# Patient Record
Sex: Female | Born: 1975 | Race: Black or African American | Hispanic: No | Marital: Married | Smoking: Never smoker
Health system: Southern US, Community
[De-identification: ages and names within clinical notes are randomized; demographics above are authoritative.]

## PROBLEM LIST (undated history)

## (undated) DIAGNOSIS — N9489 Other specified conditions associated with female genital organs and menstrual cycle: Secondary | ICD-10-CM

## (undated) DIAGNOSIS — N939 Abnormal uterine and vaginal bleeding, unspecified: Secondary | ICD-10-CM

## (undated) HISTORY — PX: NO PAST SURGERIES: SHX2092

---

## 2017-11-15 ENCOUNTER — Other Ambulatory Visit: Payer: Self-pay

## 2017-11-15 ENCOUNTER — Emergency Department (HOSPITAL_COMMUNITY)
Admission: EM | Admit: 2017-11-15 | Discharge: 2017-11-15 | Disposition: A | Discharge status: Home / Self Care | Payer: Self-pay | Attending: Emergency Medicine | Admitting: Emergency Medicine

## 2017-11-15 ENCOUNTER — Encounter (HOSPITAL_COMMUNITY): Payer: Self-pay

## 2017-11-15 DIAGNOSIS — R1030 Lower abdominal pain, unspecified: Secondary | ICD-10-CM | POA: Insufficient documentation

## 2017-11-15 DIAGNOSIS — N939 Abnormal uterine and vaginal bleeding, unspecified: Secondary | ICD-10-CM | POA: Insufficient documentation

## 2017-11-15 LAB — COMPREHENSIVE METABOLIC PANEL
ALBUMIN: 4.2 g/dL (ref 3.5–5.0)
ALK PHOS: 77 U/L (ref 38–126)
ALT: 18 U/L (ref 14–54)
AST: 24 U/L (ref 15–41)
Anion gap: 10 (ref 5–15)
BILIRUBIN TOTAL: 0.5 mg/dL (ref 0.3–1.2)
BUN: 6 mg/dL (ref 6–20)
CALCIUM: 9.3 mg/dL (ref 8.9–10.3)
CO2: 21 mmol/L — AB (ref 22–32)
CREATININE: 0.89 mg/dL (ref 0.44–1.00)
Chloride: 108 mmol/L (ref 101–111)
GFR calc non Af Amer: >60 mL/min (ref >60)
GLUCOSE: 87 mg/dL (ref 65–99)
Potassium: 3.9 mmol/L (ref 3.5–5.1)
Sodium: 139 mmol/L (ref 135–145)
TOTAL PROTEIN: 7.4 g/dL (ref 6.5–8.1)

## 2017-11-15 LAB — URINALYSIS, ROUTINE W REFLEX MICROSCOPIC
Bacteria, UA: NONE SEEN
Bilirubin Urine: NEGATIVE
Glucose, UA: NEGATIVE mg/dL
Ketones, ur: NEGATIVE mg/dL
Leukocytes, UA: NEGATIVE
NITRITE: NEGATIVE
PH: 5 (ref 5.0–8.0)
PROTEIN: NEGATIVE mg/dL
SPECIFIC GRAVITY, URINE: 1.014 (ref 1.005–1.030)

## 2017-11-15 LAB — WET PREP, GENITAL
CLUE CELLS WET PREP: NONE SEEN
SPERM: NONE SEEN
Trich, Wet Prep: NONE SEEN
YEAST WET PREP: NONE SEEN

## 2017-11-15 LAB — CBC
HCT: 41.6 % (ref 36.0–46.0)
HEMOGLOBIN: 13.5 g/dL (ref 12.0–15.0)
MCH: 26.3 pg (ref 26.0–34.0)
MCHC: 32.5 g/dL (ref 30.0–36.0)
MCV: 80.9 fL (ref 78.0–100.0)
PLATELETS: 314 10*3/uL (ref 150–400)
RBC: 5.14 MIL/uL — ABNORMAL HIGH (ref 3.87–5.11)
RDW: 13.2 % (ref 11.5–15.5)
WBC: 3.7 10*3/uL — ABNORMAL LOW (ref 4.0–10.5)

## 2017-11-15 LAB — I-STAT BETA HCG BLOOD, ED (MC, WL, AP ONLY): I-stat hCG, quantitative: <5 m[IU]/mL (ref <5)

## 2017-11-15 LAB — LIPASE, BLOOD: Lipase: 36 U/L (ref 11–51)

## 2017-11-15 MED ORDER — NAPROXEN 500 MG PO TABS: 500.0000 mg | ORAL_TABLET | Freq: Two times a day (BID) | ORAL | 0 refills | Status: DC

## 2017-11-15 NOTE — ED Provider Notes (Signed)
[facility] EMERGENCY DEPARTMENT Provider Note   CSN: [id number] Arrival date & time: 11/15/17  1516     History   Chief Complaint Chief Complaint  Patient presents with  . Vaginal Bleeding   History obtained with assistance of patient's husband who helps translate.  [employer] interpreter offered to patient but she denied at this time. HPI Othell Gajda is a 42 y.o. female with no significant past medical history presents emergency department today for vaginal bleeding.  Patient states that she recently did her menstrual cycle on 11/09/2017 after starting on 10/26/2017.  She reports that over the last 3 days she has had clear/bloody vaginal discharge that she states is similar to a normal period for her.  She states that over the last 3 days the bleeding has gotten heavier and now has used 2 pads per day.  She also notes that there is a foul odor and suprapubic cramping involved.  The patient reports that she has had to have intermittent and abnormal menstrual cycles over the last several months.  He is currently sexually active with her husband in a monogamous relationship.  They do not always use protection.  She does not have concerns for STDs at this time.  No prior history of STDs.  She denies any associated fever, chills, nausea/vomiting/diarrhea, vaginal ulcers/lesions, vaginal itching, vaginal pain, urinary frequency, urinary urgency, hematuria, dysuria or flank pain.  HPI  No past medical history on file.  There are no active problems to display for this patient.     OB History    No data available       Home Medications    Prior to Admission medications   Not on File    Family History No family history on file.  Social History Social History   Tobacco Use  . Smoking status: Never Smoker  . Smokeless tobacco: Never Used  Substance Use Topics  . Alcohol use: No    Frequency: Never  . Drug use: Not on file     Allergies   Patient has no  known allergies.   Review of Systems Review of Systems  All other systems reviewed and are negative.    Physical Exam Updated Vital Signs BP 119/82 (BP Location: Right Arm)   Pulse 86   Temp 98.8 F (37.1 C) (Oral)   Resp 14   LMP 11/09/2017 (Within Days)   SpO2 100%   Physical Exam  Constitutional: She appears well-developed and well-nourished.  HENT:  Head: Normocephalic and atraumatic.  Right Ear: External ear normal.  Left Ear: External ear normal.  Nose: Nose normal.  Mouth/Throat: Uvula is midline, oropharynx is clear and moist and mucous membranes are normal. No tonsillar exudate.  Eyes: Pupils are equal, round, and reactive to light. Right eye exhibits no discharge. Left eye exhibits no discharge. No scleral icterus.  Neck: Trachea normal. Neck supple. No spinous process tenderness present. No neck rigidity. Normal range of motion present.  Cardiovascular: Normal rate, regular rhythm and intact distal pulses.  No murmur heard. Pulses:      Radial pulses are 2+ on the right side, and 2+ on the left side.       Dorsalis pedis pulses are 2+ on the right side, and 2+ on the left side.       Posterior tibial pulses are 2+ on the right side, and 2+ on the left side.  No lower extremity swelling or edema. Calves symmetric in size bilaterally.  Pulmonary/Chest: Effort normal  and breath sounds normal. She exhibits no tenderness.  Abdominal: Soft. Bowel sounds are normal. There is no tenderness. There is no rigidity, no rebound, no guarding and no CVA tenderness.  Genitourinary:  Genitourinary Comments: Exam performed by Jacinto HalimMichael M Braidan Ricciardi, exam chaperoned Pelvic exam: normal external genitalia without evidence of trauma. VULVA: normal appearing vulva with no masses, tenderness or lesion. VAGINA: normal appearing vagina with normal color and discharge, no lesions. CERVIX: normal appearing cervix without lesions, cervical motion tenderness absent, cervical os closed with scant  blood in the cervical vault. Wet prep and DNA probe for chlamydia and GC obtained.   ADNEXA: normal adnexa in size, nontender and no masses UTERUS: uterus is normal size, shape, consistency and nontender.   Musculoskeletal: She exhibits no edema.  Lymphadenopathy:    She has no cervical adenopathy.  Neurological: She is alert.  Skin: Skin is warm and dry. No rash noted. She is not diaphoretic.  Psychiatric: She has a normal mood and affect.  Nursing note and vitals reviewed.    ED Treatments / Results  Labs (all labs ordered are listed, but only abnormal results are displayed) Labs Reviewed  COMPREHENSIVE METABOLIC PANEL - Abnormal; Notable for the following components:      Result Value   CO2 21 (*)    All other components within normal limits  CBC - Abnormal; Notable for the following components:   WBC 3.7 (*)    RBC 5.14 (*)    All other components within normal limits  URINALYSIS, ROUTINE W REFLEX MICROSCOPIC - Abnormal; Notable for the following components:   Hgb urine dipstick MODERATE (*)    Squamous Epithelial / LPF 0-5 (*)    All other components within normal limits  LIPASE, BLOOD  I-STAT BETA HCG BLOOD, ED ([facility], [facility], [facility] ONLY)    EKG  EKG Interpretation None       Radiology No results found.  Procedures Procedures (including critical care time)  Medications Ordered in ED Medications - No data to display   Initial Impression / Assessment and Plan / ED Course  I have reviewed the triage vital signs and the nursing notes.  Pertinent labs & imaging results that were available during my care of the patient were reviewed by me and considered in my medical decision making (see chart for details).     42 y.o. female presenting with 3 day history of vaginal bleeding after finishing her menstrual cycle on 11/09/2017.  She notes that she has had intermittent this regularity is with her menstrual cycle for the last several months.  Patient reports that her bleeding  has gotten heavier now and she has used 2 pads today.  There is also a foul odor associated with this suprapubic cramping.  No passing of clots.  She is currently sexually active in a monogamous relationship.  She does not have concerned for STDs at this time.  Patient's vital signs are reassuring.  No hypotension on presentation.  No abdominal tenderness to palpation.  Cervical os is closed and there is scant blood in the cervical vault.  No concern for PID.  Pregnancy test negative.  No concern for ectopic pregnancy or abortion at this time. Patient states she is currently trying to get pregnant and would not like any interventions. She does not have an OBGYN and I will refer her to women's outpatient.   Patient's lab works and UA otherwise reassuring.  Wet prep reassuring.  G/C pending.  No concern for STDs at this  time to treat prophylactically. Specific return precautions discussed. Time was given for all questions to be answered. The patient verbalized understanding and agreement with plan. The patient appears safe for discharge home.  Final Clinical Impressions(s) / ED Diagnoses   Final diagnoses:  Vaginal bleeding    ED Discharge Orders        Ordered    naproxen (NAPROSYN) 500 MG tablet  2 times daily     11/15/17 2234       Princella Pellegrini 11/15/17 2235    Mancel Bale, MD 11/16/17 [phone]

## 2017-11-15 NOTE — ED Triage Notes (Signed)
Pt had her period at the end of last month and is now having vaginal bleeding. Pt states the bleeding has gotten heavier and darker and has a foul odor.

## 2017-11-15 NOTE — Discharge Instructions (Signed)
Your workup today was reassuring.  Your pregnancy test was negative Your lab work did not show anemia.  You were offered OCP for your symptoms but declined at this time.  Please follow up with your OBGYN or use the referral provided.  If you develop worsening or new concerning symptoms you can return to the emergency department for re-evaluation.

## 2017-11-16 LAB — GC/CHLAMYDIA PROBE AMP (~~LOC~~) NOT AT ARMC
Chlamydia: NEGATIVE
Neisseria Gonorrhea: NEGATIVE

## 2017-11-28 ENCOUNTER — Other Ambulatory Visit: Payer: Self-pay | Admitting: Obstetrics and Gynecology

## 2017-11-28 ENCOUNTER — Other Ambulatory Visit (HOSPITAL_COMMUNITY)
Admission: RE | Admit: 2017-11-28 | Discharge: 2017-11-28 | Disposition: A | Discharge status: Home / Self Care | Payer: Self-pay | Source: Ambulatory Visit | Attending: Obstetrics and Gynecology | Admitting: Obstetrics and Gynecology

## 2017-11-28 DIAGNOSIS — Z124 Encounter for screening for malignant neoplasm of cervix: Secondary | ICD-10-CM | POA: Insufficient documentation

## 2017-11-29 LAB — CYTOLOGY - PAP
Diagnosis: NEGATIVE
HPV: NOT DETECTED

## 2017-12-04 ENCOUNTER — Other Ambulatory Visit: Payer: Self-pay | Admitting: Obstetrics and Gynecology

## 2017-12-06 ENCOUNTER — Other Ambulatory Visit: Payer: Self-pay | Admitting: Obstetrics and Gynecology

## 2017-12-06 DIAGNOSIS — Z1231 Encounter for screening mammogram for malignant neoplasm of breast: Secondary | ICD-10-CM

## 2018-02-07 ENCOUNTER — Ambulatory Visit
Admission: RE | Admit: 2018-02-07 | Discharge: 2018-02-07 | Disposition: A | Discharge status: Home / Self Care | Payer: No Typology Code available for payment source | Source: Ambulatory Visit | Attending: Obstetrics and Gynecology | Admitting: Obstetrics and Gynecology

## 2018-02-07 DIAGNOSIS — Z1231 Encounter for screening mammogram for malignant neoplasm of breast: Secondary | ICD-10-CM

## 2018-03-29 ENCOUNTER — Other Ambulatory Visit (HOSPITAL_COMMUNITY): Payer: Self-pay | Admitting: *Deleted

## 2018-03-29 DIAGNOSIS — R928 Other abnormal and inconclusive findings on diagnostic imaging of breast: Secondary | ICD-10-CM

## 2018-04-27 ENCOUNTER — Telehealth (HOSPITAL_COMMUNITY): Payer: Self-pay | Admitting: *Deleted

## 2018-04-27 NOTE — Telephone Encounter (Signed)
Telephoned patient at home number and left message regarding B[facility]CP appointment.

## 2018-05-10 ENCOUNTER — Ambulatory Visit
Admission: RE | Admit: 2018-05-10 | Discharge: 2018-05-10 | Disposition: A | Discharge status: Home / Self Care | Payer: No Typology Code available for payment source | Source: Ambulatory Visit | Attending: Obstetrics and Gynecology | Admitting: Obstetrics and Gynecology

## 2018-05-10 ENCOUNTER — Ambulatory Visit (HOSPITAL_COMMUNITY)
Admission: RE | Admit: 2018-05-10 | Discharge: 2018-05-10 | Disposition: A | Discharge status: Home / Self Care | Payer: Self-pay | Source: Ambulatory Visit | Attending: Obstetrics and Gynecology | Admitting: Obstetrics and Gynecology

## 2018-05-10 ENCOUNTER — Encounter (HOSPITAL_COMMUNITY): Payer: Self-pay

## 2018-05-10 ENCOUNTER — Other Ambulatory Visit: Payer: No Typology Code available for payment source

## 2018-05-10 ENCOUNTER — Other Ambulatory Visit (HOSPITAL_COMMUNITY): Payer: Self-pay | Admitting: Obstetrics and Gynecology

## 2018-05-10 ENCOUNTER — Ambulatory Visit (HOSPITAL_COMMUNITY): Payer: No Typology Code available for payment source

## 2018-05-10 VITALS — BP 120/82

## 2018-05-10 DIAGNOSIS — N6310 Unspecified lump in the right breast, unspecified quadrant: Secondary | ICD-10-CM

## 2018-05-10 DIAGNOSIS — R928 Other abnormal and inconclusive findings on diagnostic imaging of breast: Secondary | ICD-10-CM

## 2018-05-10 DIAGNOSIS — Z1239 Encounter for other screening for malignant neoplasm of breast: Secondary | ICD-10-CM

## 2018-05-10 NOTE — Patient Instructions (Signed)
Explained breast self awareness with Rachel Hodge. Patient did not need a Pap smear today due to last Pap smear and HPV typing was 11/28/2017. Let her know [facility] will cover Pap smears and HPV typing every 5 years unless has a history of abnormal Pap smears. [facility] mamm[facility] and ultrasound per recommendation. Appointment scheduled for Thursday, May 10, 2018 at 1010. Rachel Hodge verbalized understanding.  [place], Kathaleen Maserhristine Poll, RN 10:18 AM

## 2018-05-10 NOTE — Progress Notes (Signed)
[facility] due to recommending additional imaging of the right breast. Screening mammogram completed 02/07/2018.  Pap Smear: Pap smear not completed today. [facility] and normal with negative HPV. Per patient has no history of an abnormal Pap smear. Last Pap smear result is in Epic.  Physical exam: Breasts Breasts symmetrical. No skin abnormalities bilateral breasts. No nipple retraction bilateral breasts. No nipple discharge bilateral breasts. No lymphadenopathy. No lumps palpated bilateral breasts. No complaints of pain or tenderness on exam. [facility] for a right breast diagnostic mammogram and ultrasound per recommendation. Appointment scheduled for Thursday, May 10, 2018 at 1010.        Pelvic/Bimanual No Pap smear completed today since last Pap smear and HPV typing was 11/28/2017. Pap smear not indicated per BCCCP guidelines.   Smoking History: Patient has never smoked.  Patient Navigation: Patient education provided. Access to services provided for [facility]. [place] interpreter provided.   Breast and Cervical Cancer Risk Assessment: Patient has no family history of breast cancer, known genetic mutations, or radiation treatment to the chest before age 42. Patient has no history of cervical dysplasia, immunocompromised, or DES exposure in-utero.  Risk Assessment    Risk Scores      05/10/2018   Last edited by: Lynnell DikeHolland, Sabrina H, LPN   5-year risk: 0.6 %   Lifetime risk: 9.6 %         Used [place] interpreter [employer]adi Sofo from [employer].

## 2018-05-11 ENCOUNTER — Encounter (HOSPITAL_COMMUNITY): Payer: Self-pay | Admitting: *Deleted

## 2018-05-14 ENCOUNTER — Other Ambulatory Visit (HOSPITAL_COMMUNITY): Payer: Self-pay | Admitting: Obstetrics and Gynecology

## 2018-05-14 DIAGNOSIS — N6310 Unspecified lump in the right breast, unspecified quadrant: Secondary | ICD-10-CM

## 2018-05-17 ENCOUNTER — Other Ambulatory Visit (HOSPITAL_COMMUNITY): Payer: Self-pay | Admitting: Obstetrics and Gynecology

## 2018-05-17 ENCOUNTER — Ambulatory Visit
Admission: RE | Admit: 2018-05-17 | Discharge: 2018-05-17 | Disposition: A | Discharge status: Home / Self Care | Payer: No Typology Code available for payment source | Source: Ambulatory Visit | Attending: Obstetrics and Gynecology | Admitting: Obstetrics and Gynecology

## 2018-05-17 DIAGNOSIS — N6310 Unspecified lump in the right breast, unspecified quadrant: Secondary | ICD-10-CM

## 2018-06-26 ENCOUNTER — Ambulatory Visit: Payer: Self-pay | Admitting: Internal Medicine

## 2018-07-06 ENCOUNTER — Telehealth: Payer: Self-pay | Admitting: Internal Medicine

## 2018-10-15 ENCOUNTER — Other Ambulatory Visit: Payer: Self-pay | Admitting: Obstetrics and Gynecology

## 2018-11-16 ENCOUNTER — Other Ambulatory Visit: Payer: Self-pay

## 2018-11-16 ENCOUNTER — Ambulatory Visit
Admission: RE | Admit: 2018-11-16 | Discharge: 2018-11-16 | Disposition: A | Discharge status: Home / Self Care | Payer: Self-pay | Source: Ambulatory Visit | Attending: Obstetrics and Gynecology | Admitting: Obstetrics and Gynecology

## 2018-11-16 ENCOUNTER — Ambulatory Visit: Payer: No Typology Code available for payment source

## 2018-11-16 DIAGNOSIS — N6310 Unspecified lump in the right breast, unspecified quadrant: Secondary | ICD-10-CM

## 2019-01-11 ENCOUNTER — Ambulatory Visit (HOSPITAL_BASED_OUTPATIENT_CLINIC_OR_DEPARTMENT_OTHER)
Admission: RE | Admit: 2019-01-11 | Payer: No Typology Code available for payment source | Source: Home / Self Care | Admitting: Obstetrics and Gynecology

## 2019-01-11 ENCOUNTER — Encounter (HOSPITAL_BASED_OUTPATIENT_CLINIC_OR_DEPARTMENT_OTHER): Admission: RE | Payer: Self-pay | Source: Home / Self Care

## 2019-01-11 SURGERY — DILATATION & CURETTAGE/HYSTEROSCOPY WITH MYOSURE
Anesthesia: Choice

## 2019-03-13 ENCOUNTER — Other Ambulatory Visit: Payer: Self-pay | Admitting: Obstetrics and Gynecology

## 2019-03-13 DIAGNOSIS — Z1231 Encounter for screening mammogram for malignant neoplasm of breast: Secondary | ICD-10-CM

## 2019-04-09 ENCOUNTER — Other Ambulatory Visit (HOSPITAL_COMMUNITY)
Admission: RE | Admit: 2019-04-09 | Discharge: 2019-04-09 | Disposition: A | Discharge status: Home / Self Care | Payer: 59 | Source: Ambulatory Visit | Attending: Obstetrics and Gynecology | Admitting: Obstetrics and Gynecology

## 2019-04-09 ENCOUNTER — Other Ambulatory Visit: Payer: Self-pay

## 2019-04-09 ENCOUNTER — Encounter (HOSPITAL_BASED_OUTPATIENT_CLINIC_OR_DEPARTMENT_OTHER): Payer: Self-pay | Admitting: *Deleted

## 2019-04-09 ENCOUNTER — Other Ambulatory Visit: Payer: Self-pay | Admitting: Obstetrics and Gynecology

## 2019-04-09 DIAGNOSIS — Z01812 Encounter for preprocedural laboratory examination: Secondary | ICD-10-CM | POA: Diagnosis not present

## 2019-04-09 DIAGNOSIS — Z20828 Contact with and (suspected) exposure to other viral communicable diseases: Secondary | ICD-10-CM | POA: Diagnosis not present

## 2019-04-09 LAB — SARS CORONAVIRUS 2 (TAT 6-24 HRS): SARS Coronavirus 2: NEGATIVE

## 2019-04-09 NOTE — Progress Notes (Signed)
Spoke w/ pt husband, Rachel Hodge, via phone for pre-op interview.  Husband speaks speaks and understands [place].  Pt speaks [place].  Husband verbalized understanding for his wife to be npo after mn, absolutely nothing by mouth and arrive at 1045.  Needs urine preg.  Pre-op orders pending.  Requested female french interpreter via email to interpreting [place], copy with chart.  Pt had covid test done today.

## 2019-04-12 ENCOUNTER — Encounter (HOSPITAL_BASED_OUTPATIENT_CLINIC_OR_DEPARTMENT_OTHER): Payer: Self-pay | Admitting: Certified Registered Nurse Anesthetist

## 2019-04-12 ENCOUNTER — Ambulatory Visit (HOSPITAL_BASED_OUTPATIENT_CLINIC_OR_DEPARTMENT_OTHER)
Admission: RE | Admit: 2019-04-12 | Discharge: 2019-04-12 | Disposition: A | Discharge status: Home / Self Care | Payer: 59 | Attending: Obstetrics and Gynecology | Admitting: Obstetrics and Gynecology

## 2019-04-12 ENCOUNTER — Encounter (HOSPITAL_BASED_OUTPATIENT_CLINIC_OR_DEPARTMENT_OTHER)
Admission: RE | Disposition: A | Discharge status: Home / Self Care | Payer: Self-pay | Source: Home / Self Care | Attending: Obstetrics and Gynecology

## 2019-04-12 ENCOUNTER — Ambulatory Visit (HOSPITAL_BASED_OUTPATIENT_CLINIC_OR_DEPARTMENT_OTHER): Payer: 59 | Admitting: Certified Registered Nurse Anesthetist

## 2019-04-12 ENCOUNTER — Other Ambulatory Visit: Payer: Self-pay

## 2019-04-12 DIAGNOSIS — N939 Abnormal uterine and vaginal bleeding, unspecified: Secondary | ICD-10-CM | POA: Diagnosis present

## 2019-04-12 DIAGNOSIS — N840 Polyp of corpus uteri: Secondary | ICD-10-CM | POA: Diagnosis not present

## 2019-04-12 HISTORY — DX: Abnormal uterine and vaginal bleeding, unspecified: N93.9

## 2019-04-12 HISTORY — PX: DILATATION & CURETTAGE/HYSTEROSCOPY WITH MYOSURE: SHX6511

## 2019-04-12 HISTORY — DX: Other specified conditions associated with female genital organs and menstrual cycle: N94.89

## 2019-04-12 LAB — TYPE AND SCREEN
ABO/RH(D): O POS
Antibody Screen: NEGATIVE

## 2019-04-12 LAB — ABO/RH: ABO/RH(D): O POS

## 2019-04-12 LAB — CBC
HCT: 32.2 % — ABNORMAL LOW (ref 36.0–46.0)
Hemoglobin: 8.7 g/dL — ABNORMAL LOW (ref 12.0–15.0)
MCH: 17.4 pg — ABNORMAL LOW (ref 26.0–34.0)
MCHC: 27 g/dL — ABNORMAL LOW (ref 30.0–36.0)
MCV: 64.3 fL — ABNORMAL LOW (ref 80.0–100.0)
Platelets: 455 10*3/uL — ABNORMAL HIGH (ref 150–400)
RBC: 5.01 MIL/uL (ref 3.87–5.11)
RDW: 18.9 % — ABNORMAL HIGH (ref 11.5–15.5)
WBC: 3.6 10*3/uL — ABNORMAL LOW (ref 4.0–10.5)
nRBC: 0 % (ref 0.0–0.2)

## 2019-04-12 LAB — POCT PREGNANCY, URINE: Preg Test, Ur: NEGATIVE

## 2019-04-12 SURGERY — DILATATION & CURETTAGE/HYSTEROSCOPY WITH MYOSURE
Anesthesia: General

## 2019-04-12 MED ORDER — ACETAMINOPHEN 500 MG PO TABS
1000.0000 mg | ORAL_TABLET | ORAL | Status: AC
  Administered 2019-04-12: 1000 mg via ORAL
  Filled 2019-04-12: qty 2

## 2019-04-12 MED ORDER — ACETAMINOPHEN 160 MG/5ML PO SOLN
325.0000 mg | ORAL | Status: DC | PRN
  Filled 2019-04-12: qty 20.3

## 2019-04-12 MED ORDER — KETOROLAC TROMETHAMINE 30 MG/ML IJ SOLN
INTRAMUSCULAR | Status: AC
  Filled 2019-04-12: qty 1

## 2019-04-12 MED ORDER — FENTANYL CITRATE (PF) 100 MCG/2ML IJ SOLN
25.0000 ug | INTRAMUSCULAR | Status: DC | PRN
  Filled 2019-04-12: qty 1

## 2019-04-12 MED ORDER — OXYCODONE HCL 5 MG PO TABS
5.0000 mg | ORAL_TABLET | Freq: Once | ORAL | Status: DC | PRN
  Filled 2019-04-12: qty 1

## 2019-04-12 MED ORDER — MIDAZOLAM HCL 5 MG/5ML IJ SOLN
INTRAMUSCULAR | Status: DC | PRN
  Administered 2019-04-12: 2 mg via INTRAVENOUS

## 2019-04-12 MED ORDER — ACETAMINOPHEN 325 MG PO TABS
325.0000 mg | ORAL_TABLET | ORAL | Status: DC | PRN
  Filled 2019-04-12: qty 2

## 2019-04-12 MED ORDER — MEPERIDINE HCL 25 MG/ML IJ SOLN
6.2500 mg | INTRAMUSCULAR | Status: DC | PRN
  Filled 2019-04-12: qty 1

## 2019-04-12 MED ORDER — HYDROCODONE-ACETAMINOPHEN 5-325 MG PO TABS: 1.0000 | ORAL_TABLET | Freq: Four times a day (QID) | ORAL | 0 refills | Status: AC | PRN

## 2019-04-12 MED ORDER — DEXAMETHASONE SODIUM PHOSPHATE 4 MG/ML IJ SOLN
INTRAMUSCULAR | Status: DC | PRN
  Administered 2019-04-12: 4 mg via INTRAVENOUS

## 2019-04-12 MED ORDER — GLYCOPYRROLATE PF 0.2 MG/ML IJ SOSY
PREFILLED_SYRINGE | INTRAMUSCULAR | Status: AC
  Filled 2019-04-12: qty 1

## 2019-04-12 MED ORDER — PROPOFOL 10 MG/ML IV BOLUS
INTRAVENOUS | Status: AC
  Filled 2019-04-12: qty 20

## 2019-04-12 MED ORDER — ONDANSETRON HCL 4 MG/2ML IJ SOLN
INTRAMUSCULAR | Status: DC | PRN
  Administered 2019-04-12: 4 mg via INTRAVENOUS

## 2019-04-12 MED ORDER — KETOROLAC TROMETHAMINE 30 MG/ML IJ SOLN
30.0000 mg | Freq: Once | INTRAMUSCULAR | Status: DC | PRN
  Filled 2019-04-12: qty 1

## 2019-04-12 MED ORDER — BUPIVACAINE HCL (PF) 0.25 % IJ SOLN
INTRAMUSCULAR | Status: DC | PRN
  Administered 2019-04-12: 20 mL

## 2019-04-12 MED ORDER — FENTANYL CITRATE (PF) 100 MCG/2ML IJ SOLN
INTRAMUSCULAR | Status: AC
  Filled 2019-04-12: qty 2

## 2019-04-12 MED ORDER — LACTATED RINGERS IV SOLN
INTRAVENOUS | Status: DC
  Filled 2019-04-12: qty 1000

## 2019-04-12 MED ORDER — KETOROLAC TROMETHAMINE 30 MG/ML IJ SOLN
INTRAMUSCULAR | Status: DC | PRN
  Administered 2019-04-12: 30 mg via INTRAVENOUS

## 2019-04-12 MED ORDER — OXYCODONE HCL 5 MG/5ML PO SOLN
5.0000 mg | Freq: Once | ORAL | Status: DC | PRN
  Filled 2019-04-12: qty 5

## 2019-04-12 MED ORDER — LIDOCAINE HCL (CARDIAC) PF 100 MG/5ML IV SOSY
PREFILLED_SYRINGE | INTRAVENOUS | Status: DC | PRN
  Administered 2019-04-12: 80 mg via INTRAVENOUS

## 2019-04-12 MED ORDER — ACETAMINOPHEN 500 MG PO TABS
ORAL_TABLET | ORAL | Status: AC
  Filled 2019-04-12: qty 2

## 2019-04-12 MED ORDER — LACTATED RINGERS IV SOLN
INTRAVENOUS | Status: DC
  Administered 2019-04-12: 1000 mL via INTRAVENOUS
  Filled 2019-04-12: qty 1000

## 2019-04-12 MED ORDER — ONDANSETRON HCL 4 MG/2ML IJ SOLN
4.0000 mg | Freq: Once | INTRAMUSCULAR | Status: DC | PRN
  Filled 2019-04-12: qty 2

## 2019-04-12 MED ORDER — DEXAMETHASONE SODIUM PHOSPHATE 10 MG/ML IJ SOLN
INTRAMUSCULAR | Status: AC
  Filled 2019-04-12: qty 1

## 2019-04-12 MED ORDER — ONDANSETRON HCL 4 MG/2ML IJ SOLN
INTRAMUSCULAR | Status: AC
  Filled 2019-04-12: qty 2

## 2019-04-12 MED ORDER — GLYCOPYRROLATE 0.2 MG/ML IJ SOLN
INTRAMUSCULAR | Status: DC | PRN
  Administered 2019-04-12: 0.1 mg via INTRAVENOUS

## 2019-04-12 MED ORDER — PROPOFOL 10 MG/ML IV BOLUS
INTRAVENOUS | Status: DC | PRN
  Administered 2019-04-12: 160 mg via INTRAVENOUS

## 2019-04-12 MED ORDER — MIDAZOLAM HCL 2 MG/2ML IJ SOLN
INTRAMUSCULAR | Status: AC
  Filled 2019-04-12: qty 2

## 2019-04-12 MED ORDER — FENTANYL CITRATE (PF) 100 MCG/2ML IJ SOLN
INTRAMUSCULAR | Status: DC | PRN
  Administered 2019-04-12: 50 ug via INTRAVENOUS

## 2019-04-12 MED ORDER — IBUPROFEN 800 MG PO TABS: 800.0000 mg | ORAL_TABLET | Freq: Three times a day (TID) | ORAL | 0 refills | Status: AC | PRN

## 2019-04-12 MED ORDER — LIDOCAINE 2% (20 MG/ML) 5 ML SYRINGE
INTRAMUSCULAR | Status: AC
  Filled 2019-04-12: qty 5

## 2019-04-12 SURGICAL SUPPLY — 15 items
CANISTER SUCT 3000ML PPV (MISCELLANEOUS) ×3 IMPLANT
CATH ROBINSON RED A/P 16FR (CATHETERS) ×2 IMPLANT
DEVICE MYOSURE LITE (MISCELLANEOUS) IMPLANT
DEVICE MYOSURE REACH (MISCELLANEOUS) ×2 IMPLANT
FILTER ARTHROSCOPY CONVERTOR (FILTER) ×3 IMPLANT
GLOVE BIOGEL M 6.5 STRL (GLOVE) ×3 IMPLANT
GLOVE BIOGEL PI IND STRL 6.5 (GLOVE) ×1 IMPLANT
GLOVE BIOGEL PI IND STRL 7.0 (GLOVE) ×1 IMPLANT
GLOVE BIOGEL PI INDICATOR 6.5 (GLOVE) ×2
GLOVE BIOGEL PI INDICATOR 7.0 (GLOVE) ×2
GOWN STRL REUS W/TWL LRG LVL3 (GOWN DISPOSABLE) ×6 IMPLANT
KIT PROCEDURE FLUENT (KITS) ×3 IMPLANT
PACK VAGINAL MINOR WOMEN LF (CUSTOM PROCEDURE TRAY) ×3 IMPLANT
PAD OB MATERNITY 4.3X12.25 (PERSONAL CARE ITEMS) ×3 IMPLANT
SEAL ROD LENS SCOPE MYOSURE (ABLATOR) ×3 IMPLANT

## 2019-04-12 NOTE — Anesthesia Preprocedure Evaluation (Signed)
Anesthesia Evaluation  Patient identified by MRN, date of birth, ID band Patient awake    Reviewed: Allergy & Precautions, NPO status , Patient's Chart, lab work & pertinent test results  Airway Mallampati: I       Dental no notable dental hx. (+) Teeth Intact   Pulmonary neg pulmonary ROS,    Pulmonary exam normal breath sounds clear to auscultation       Cardiovascular negative cardio ROS Normal cardiovascular exam Rhythm:Regular Rate:Normal     Neuro/Psych negative neurological ROS  negative psych ROS   GI/Hepatic negative GI ROS, Neg liver ROS,   Endo/Other  negative endocrine ROS  Renal/GU negative Renal ROS  negative genitourinary   Musculoskeletal negative musculoskeletal ROS (+)   Abdominal Normal abdominal exam  (+)   Peds negative pediatric ROS (+)  Hematology negative hematology ROS (+)   Anesthesia Other Findings   Reproductive/Obstetrics negative OB ROS                             Anesthesia Physical Anesthesia Plan  ASA: I  Anesthesia Plan: General   Post-op Pain Management:    Induction: Intravenous  PONV Risk Score and Plan: Ondansetron, Dexamethasone and Midazolam  Airway Management Planned: LMA  Additional Equipment: None  Intra-op Plan:   Post-operative Plan: Extubation in OR  Informed Consent: I have reviewed the patients History and Physical, chart, labs and discussed the procedure including the risks, benefits and alternatives for the proposed anesthesia with the patient or authorized representative who has indicated his/her understanding and acceptance.     Dental advisory given  Plan Discussed with: CRNA  Anesthesia Plan Comments:         Anesthesia Quick Evaluation

## 2019-04-12 NOTE — Anesthesia Procedure Notes (Signed)
Procedure Name: LMA Insertion Date/Time: 04/12/2019 12:59 PM Performed by: Bufford Spikes, CRNA Pre-anesthesia Checklist: Patient identified, Emergency Drugs available, Suction available and Patient being monitored Patient Re-evaluated:Patient Re-evaluated prior to induction Oxygen Delivery Method: Circle system utilized Preoxygenation: Pre-oxygenation with 100% oxygen Induction Type: IV induction Ventilation: Mask ventilation without difficulty LMA: LMA inserted LMA Size: 4.0 Number of attempts: 1 Airway Equipment and Method: Bite block Placement Confirmation: positive ETCO2 Tube secured with: Tape Dental Injury: Teeth and Oropharynx as per pre-operative assessment

## 2019-04-12 NOTE — Discharge Instructions (Signed)
DISCHARGE INSTRUCTIONS: HYSTEROSCOPY  The following instructions have been prepared to help you care for yourself upon your return home.  May Remove Scop patch on or before  May take Ibuprofen after  May take stool softner while taking narcotic pain medication to prevent constipation.  Drink plenty of water.  Personal hygiene:  Use sanitary pads for vaginal drainage, not tampons.  Shower the day after your procedure.  NO tub baths, pools or Jacuzzis for 2-3 weeks.  Wipe front to back after using the bathroom.  Activity and limitations:  Do NOT drive or operate any equipment for 24 hours. The effects of anesthesia are still present and drowsiness may result.  Do NOT rest in bed all day.  Walking is encouraged.  Walk up and down stairs slowly.  You may resume your normal activity in one to two days or as indicated by your physician. Sexual activity: NO intercourse for at least 2 weeks after the procedure, or as indicated by your Doctor.  Diet: Eat a light meal as desired this evening. You may resume your usual diet tomorrow.  Return to Work: You may resume your work activities in one to two days or as indicated by Marine scientist.  What to expect after your surgery: Expect to have vaginal bleeding/discharge for 2-3 days and spotting for up to 10 days. It is not unusual to have soreness for up to 1-2 weeks. You may have a slight burning sensation when you urinate for the first day. Mild cramps may continue for a couple of days. You may have a regular period in 2-6 weeks.  Call your doctor for any of the following:  Excessive vaginal bleeding or clotting, saturating and changing one pad every hour.  Inability to urinate 6 hours after discharge from hospital.  Pain not relieved by pain medication.  Fever of 100.4 F or greater.  Unusual vaginal discharge or odor.   Post Anesthesia Home Care Instructions  Activity: Get plenty of rest for the remainder of the day. A  responsible individual must stay with you for 24 hours following the procedure.  For the next 24 hours, DO NOT: -Drive a car -Paediatric nurse -Drink alcoholic beverages -Take any medication unless instructed by your physician -Make any legal decisions or sign important papers.  Meals: Start with liquid foods such as gelatin or soup. Progress to regular foods as tolerated. Avoid greasy, spicy, heavy foods. If nausea and/or vomiting occur, drink only clear liquids until the nausea and/or vomiting subsides. Call your physician if vomiting continues.  Special Instructions/Symptoms: Your throat may feel dry or sore from the anesthesia or the breathing tube placed in your throat during surgery. If this causes discomfort, gargle with warm salt water. The discomfort should disappear within 24 hours.

## 2019-04-12 NOTE — Transfer of Care (Signed)
Immediate Anesthesia Transfer of Care Note  Patient: Rachel Hodge  Procedure(s) Performed: DILATATION & CURETTAGE/HYSTEROSCOPY WITH MYOSURE (N/A )  Patient Location: PACU  Anesthesia Type:General  Level of Consciousness: awake, alert  and oriented  Airway & Oxygen Therapy: Patient Spontanous Breathing and Patient connected to nasal cannula oxygen  Post-op Assessment: Report given to RN and Post -op Vital signs reviewed and stable  Post vital signs: Reviewed and stable  Last Vitals:  Vitals Value Taken Time  BP 129/83 04/12/19 1400  Temp 36.4 C 04/12/19 1344  Pulse 64 04/12/19 1402  Resp 18 04/12/19 1402  SpO2 100 % 04/12/19 1402  Vitals shown include unvalidated device data.  Last Pain:  Vitals:   04/12/19 1150  TempSrc:   PainSc: 1       Patients Stated Pain Goal: 4 (53/29/92 4268)  Complications: No apparent anesthesia complications

## 2019-04-12 NOTE — H&P (Signed)
Date of Initial H&P: 04/12/2019 History reviewed, patient examined, no change in status, stable for surgery.

## 2019-04-12 NOTE — Op Note (Addendum)
   1:40 PM  PATIENT:  Rachel Hodge  43 y.o. female  PRE-OPERATIVE DIAGNOSIS:  N93.9 Abnormal uterine bleeding N94.89 endometrial mass  POST-OPERATIVE DIAGNOSIS:  N93.9 Abnormal uterine bleeding N94.89 endometrial mass  PROCEDURE:  Procedure(s): DILATATION & CURETTAGE/HYSTEROSCOPY WITH MYOSURE (N/A)  SURGEON:  Surgeon(s) and Role:    Christophe Louis, MD - Primary  PHYSICIAN ASSISTANT:   ASSISTANTS: none   ANESTHESIA:   general  EBL:  10 mL   BLOOD ADMINISTERED:none  DRAINS: none   LOCAL MEDICATIONS USED:  MARCAINE     SPECIMEN:  Source of Specimen:  endometrial polyps and endometrial currettings  DISPOSITION OF SPECIMEN:  PATHOLOGY  COUNTS:  YES  TOURNIQUET:  * No tourniquets in log *  DICTATION: .Dragon Dictation  PLAN OF CARE: Discharge to home after PACU  PATIENT DISPOSITION:  PACU - hemodynamically stable.   Delay start of Pharmacological VTE agent (>24hrs) due to surgical blood loss or risk of bleeding: not applicable  Findings normal external genitalia normal cervix. Proliferative endometrium with multiple polyps noted   Procedure: Patient was taken to the operating room where she was placed under general anesthesia. She was placed in the dorsal lithotomy position. She was prepped and draped in the usual sterile fashion. A speculum was placed into the vaginal vault. The anterior lip of the cervix was grasped with a single-tooth tenaculum. Quarter percent Marcaine was injected at the 4 and 8:00 positions of the cervix. The cervix was then sounded to 8 cm. The cervix was dilated to approximately 6 mm.  The hysteroscope was inserted with the findings noted above. The reach myosure blade was used to resect the polyps.  A Sharp curet was introduced and endometrial curettings were obtained. The hysteroscope was then reinserted. There was no evidence of endometrial polyps or masses with reinsertion of the hysteroscope. There was no evidence of perforation. Hysteroscope  was then removed. The single-tooth tenaculum was removed from the anterior lip of the cervix. Bleeding was noted. Hemostasis was obtained with silver nitrate.  Excellent hemostasis was noted. The speculum was removed from the patient's vagina. She was awakened from anesthesia taken care to the recovery room awake and in stable condition. Sponge lap and needle counts were correct x2.  Glycine deficit was 25cc.

## 2019-04-12 NOTE — H&P (Signed)
Reason for Appointment  1. Pre op   History of Present Illness  Isolation Precautions:  Respiratory Illness Screening 1. Is fever present / reported? No, 2. Are respiratory illness symptom(s) present / reported? No, 3. Are other symptom(s) present / reported? No, 5. Has there been reported travel to a High Risk respiratory illness region? No, 6. Has close* contact with person(s) known to have communicable illness been reported? No, 7. Did travel or close contact (if applicable) occur within 14 days of symptom onset? No.  General:  43 y/o presents for pre op visit in preparation of hysteroscopy d&c and possible removal of any masses Pt. speaks [place], interpreter present for visit.  She had an endometrial biopsy performed on 10/15/2018. Pathology was benign.  Her ultrasound 12/26/2017 reveals a 8 cm x 5 cm x 5 cm uterus. posterior fundal fibroid 2.3 cm. the endometrium is thickened and irregular with chogenic and hypoechoic components within. ? multiple hyperechoic masses fundal wall 1.5 cm and mid wall 2.3 cm + blood flow noted within the endometrial cavity. the ovaries appear normal. she is interested in future fertility.  Menses on 08/07/2018, lasted for 5 days. She states that it hasn't been heavy. She experienced some spotting a couple days prior to menses started. On heaviest day of menses she was changing a pad/tampon every 3-4 hours. She experiences 3-4 days of heavy menses. Pt. states that she still has some intermenstrual bleeding. Her HGB on 11/15/2017 at [facility] was 13.5. Her LMP was on 03/04/2019, which lasted for 7 days. She states that she is still experiencing breakthrough bleeding.  Pt. expresses that she wants to discuss fertility following surgery. Pt. denies experiencing any vaginal discharge that itches, burns, or has an odor.  Upon pelvic examination no abnormal discharge visualized, no masses were detected.     Current Medications  None   Past Medical History   Medical History Verified..    Surgical History  No Surgical History documented.   Family History  Father: deceased  Mother: alive  68 brother(s) , 1 sister(s) .   denies any GYN family cancer hx.   Social History  General:  no Alcohol. Children: none. Tobacco use cigarettes: Never smoked, Tobacco history last updated 03/22/2019. Marital Status: married. no Recreational drug use. OCCUPATION: unemployed.    Gyn History  Sexual activity currently sexually active.  Periods : every month.  LMP 03/03/2019.  Birth control none.  Last pap smear date 11/28/17-neg/HPV.  Last mammogram date 02/07/2018.  Denies H/O Abnormal pap smear.    OB History  Never been pregnant per patient.    Allergies  N.K.D.A.   Hospitalization/Major Diagnostic Procedure  No Hospitalization History.   Review of Systems  CONSTITUTIONAL:  Chills No. Fatigue No. Fever No. Night sweats No. Recent travel outside [place] No. Sweats No. Weight change No.  OPHTHALMOLOGY:  Blurring of vision no. Change in vision no. Double vision no.  ENT:  Dizziness no. Nose bleeds no. Sore throat no. Teeth pain no.  ALLERGY:  Hives no.  CARDIOLOGY:  Chest pain no. High blood pressure no. Irregular heart beat no. Leg edema no. Palpitations no.  RESPIRATORY:  Shortness of breath no. Cough no. Wheezing no.  UROLOGY:  Pain with urination no. Urinary urgency no. Urinary frequency no. Urinary incontinence no. Difficulty urinating No. Blood in urine No.  GASTROENTEROLOGY:  Abdominal pain no. Appetite change no. Bloating/belching no. Blood in stool or on toilet paper no. Change in bowel movements no. Constipation no. Diarrhea no.  Difficulty swallowing no. Nausea no.  FEMALE REPRODUCTIVE:  Vulvar pain no. Vulvar rash no. Abnormal vaginal bleeding yes. Breast pain no. Nipple discharge no. Pain with intercourse no. Pelvic pain no. Unusual vaginal discharge no. Vaginal itching no.  MUSCULOSKELETAL:  Muscle aches no.  NEUROLOGY:  Headache  no. Tingling/numbness no. Weakness no.  PSYCHOLOGY:  Depression no. Anxiety no. Nervousness no. Sleep disturbances no. Suicidal ideation no .  ENDOCRINOLOGY:  Excessive thirst no. Excessive urination no. Hair loss no. Heat or cold intolerance no.  HEMATOLOGY/LYMPH:  Abnormal bleeding no. Easy bruising no. Swollen glands no.  DERMATOLOGY:  New/changing skin lesion no. Rash no. Sores no.  Negative except as stated in HPI.    Vital Signs  Wt 195.4, Wt change 3.4 lb, Ht 68, BMI 29.71, Temp 96.5, Pulse sitting 96, BP sitting 128/88.   Examination  General Examination: CONSTITUTIONAL: alert, oriented, NAD . SKIN: moist, warm. EYES: Conjunctiva clear. LUNGS: clear to auscultation bilaterally. HEART: regular rate and rhythm. ABDOMEN: soft, non-tender/non-distended, bowel sounds present . FEMALE GENITOURINARY: normal external genitalia, labia - unremarkable, vagina - pink moist mucosa, no lesions or abnormal discharge, cervix - no discharge or lesions or CMT, adnexa - no masses or tenderness, uterus - nontender and normal size on palpation . EXTREMITIES: no edema present. PSYCH: affect normal, good eye contact.     Physical Examination  Pt aware of scribe services today.   Assessments   1. Abnormal uterine bleeding (AUB) - N93.9 (Primary)   2. Endometrial mass - N94.89   Treatment  1. Abnormal uterine bleeding (AUB)  Notes: Pt. is schedule for hysteroscopy d&c.Marland Kitchen. removal of endometrial mass on 04/12/2019 Discussed the risk of surgery with patient including but not limited to infection, bleeding, and perforation of the uterus with the need for further surgery. Pt. advised she will be going home same day. No sexual intercourse for 2 weeks following surgery. She is advised she can resume normal activities aside from sexual intercourse within 24 hours following surgery. Pt. advised she will have covid-19 testing at least 72 hours prior to surgery. Pt. advised no eating or drinking midnight prior to  surgery. Plan to prescribe Cytotec to be inserted into vagina night prior to surgery. Plan to see 2 weeks following surgery for post op visit.    2. Endometrial mass  Notes: Pt. is scheduled for hysteroscopy d&c with possible removal of endometrial mass.

## 2019-04-12 NOTE — Anesthesia Postprocedure Evaluation (Signed)
Anesthesia Post Note  Patient: Rachel Hodge  Procedure(s) Performed: DILATATION & CURETTAGE/HYSTEROSCOPY WITH MYOSURE (N/A )     Patient location during evaluation: Phase II Anesthesia Type: General Level of consciousness: awake Pain management: pain level controlled Vital Signs Assessment: post-procedure vital signs reviewed and stable Respiratory status: spontaneous breathing Cardiovascular status: stable Postop Assessment: no apparent nausea or vomiting Anesthetic complications: no    Last Vitals:  Vitals:   04/12/19 1400 04/12/19 1415  BP: 129/83 (!) 140/94  Pulse: 64 (!) 59  Resp: 15 16  Temp:    SpO2: 96% 98%    Last Pain:  Vitals:   04/12/19 1415  TempSrc:   PainSc: 0-No pain   Pain Goal: Patients Stated Pain Goal: 4 (04/12/19 1205)                 Huston Foley

## 2019-04-15 ENCOUNTER — Encounter (HOSPITAL_BASED_OUTPATIENT_CLINIC_OR_DEPARTMENT_OTHER): Payer: Self-pay | Admitting: Obstetrics and Gynecology

## 2019-05-03 ENCOUNTER — Other Ambulatory Visit: Payer: Self-pay

## 2019-05-03 ENCOUNTER — Ambulatory Visit
Admission: RE | Admit: 2019-05-03 | Discharge: 2019-05-03 | Disposition: A | Discharge status: Home / Self Care | Payer: 59 | Source: Ambulatory Visit | Attending: Obstetrics and Gynecology | Admitting: Obstetrics and Gynecology

## 2019-05-03 DIAGNOSIS — Z1231 Encounter for screening mammogram for malignant neoplasm of breast: Secondary | ICD-10-CM

## 2019-05-07 ENCOUNTER — Other Ambulatory Visit: Payer: Self-pay | Admitting: Obstetrics and Gynecology

## 2019-05-07 DIAGNOSIS — R928 Other abnormal and inconclusive findings on diagnostic imaging of breast: Secondary | ICD-10-CM

## 2019-05-10 ENCOUNTER — Other Ambulatory Visit: Payer: 59

## 2019-05-20 ENCOUNTER — Other Ambulatory Visit: Payer: Self-pay | Admitting: Obstetrics and Gynecology

## 2019-05-20 ENCOUNTER — Ambulatory Visit
Admission: RE | Admit: 2019-05-20 | Discharge: 2019-05-20 | Disposition: A | Discharge status: Home / Self Care | Payer: 59 | Source: Ambulatory Visit | Attending: Obstetrics and Gynecology | Admitting: Obstetrics and Gynecology

## 2019-05-20 ENCOUNTER — Other Ambulatory Visit: Payer: Self-pay

## 2019-05-20 DIAGNOSIS — N6320 Unspecified lump in the left breast, unspecified quadrant: Secondary | ICD-10-CM

## 2019-05-20 DIAGNOSIS — R928 Other abnormal and inconclusive findings on diagnostic imaging of breast: Secondary | ICD-10-CM

## 2019-05-23 ENCOUNTER — Other Ambulatory Visit: Payer: Self-pay | Admitting: Obstetrics and Gynecology

## 2019-09-03 IMAGING — MG DIGITAL DIAGNOSTIC UNILATERAL RIGHT MAMMOGRAM WITH TOMO AND CAD
8 series · 8 of 24 positions shown · non-contrast
Comparison: Previous exam(s).

CLINICAL DATA: Patient presents for additional views of the right
breast as follow-up to recent screening exam to evaluate 2 possible
masses over the upper-outer breast.

EXAM:
DIGITAL DIAGNOSTIC right MAMMOGRAM
ULTRASOUND right BREAST

[R MLO synth-2D (1 of 2)]
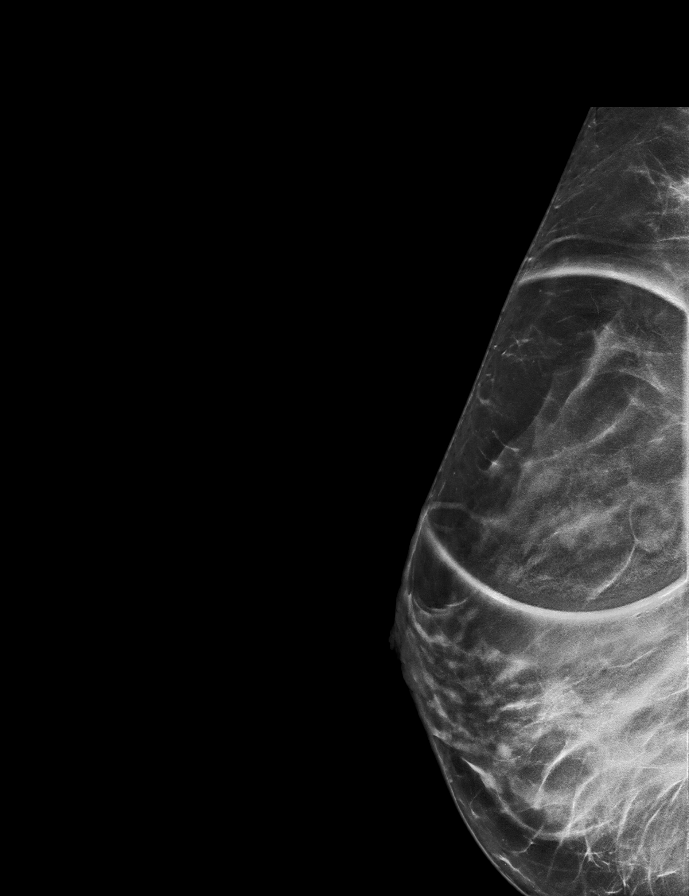

[R MLO synth-2D (2 of 2)]
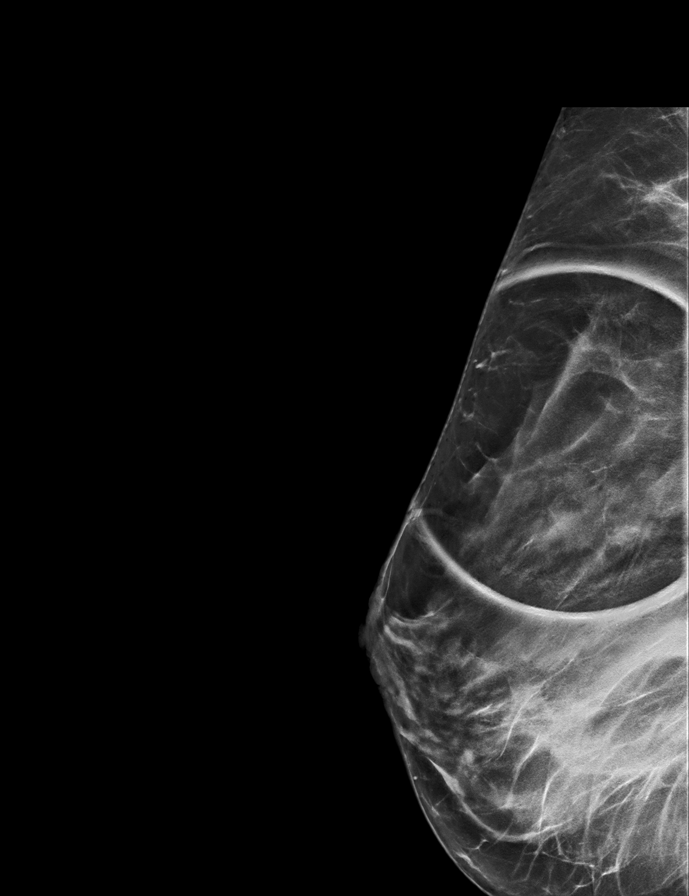

[R CC synth-2D (1 of 2)]
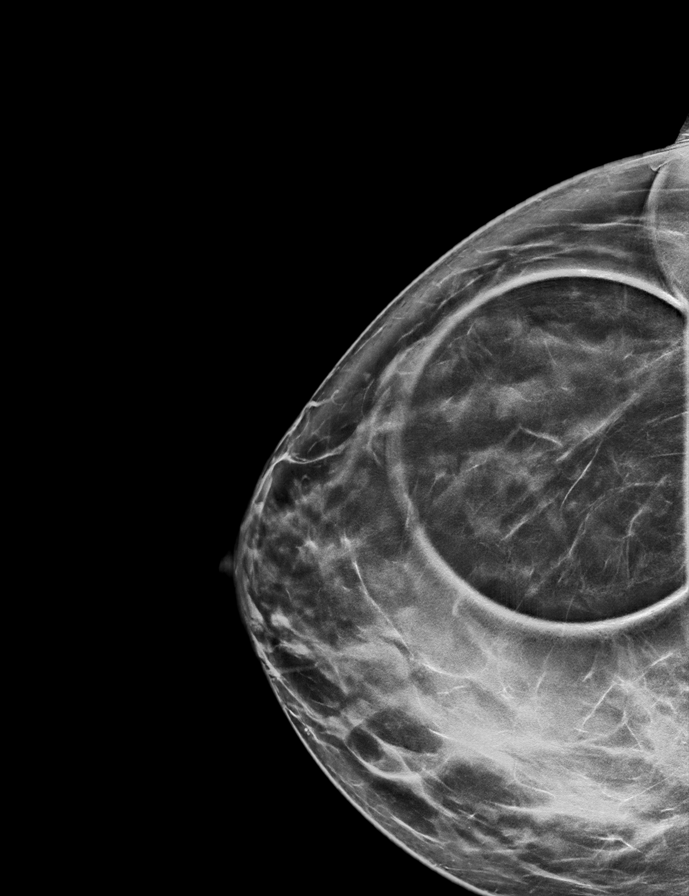

[R CC synth-2D (2 of 2)]
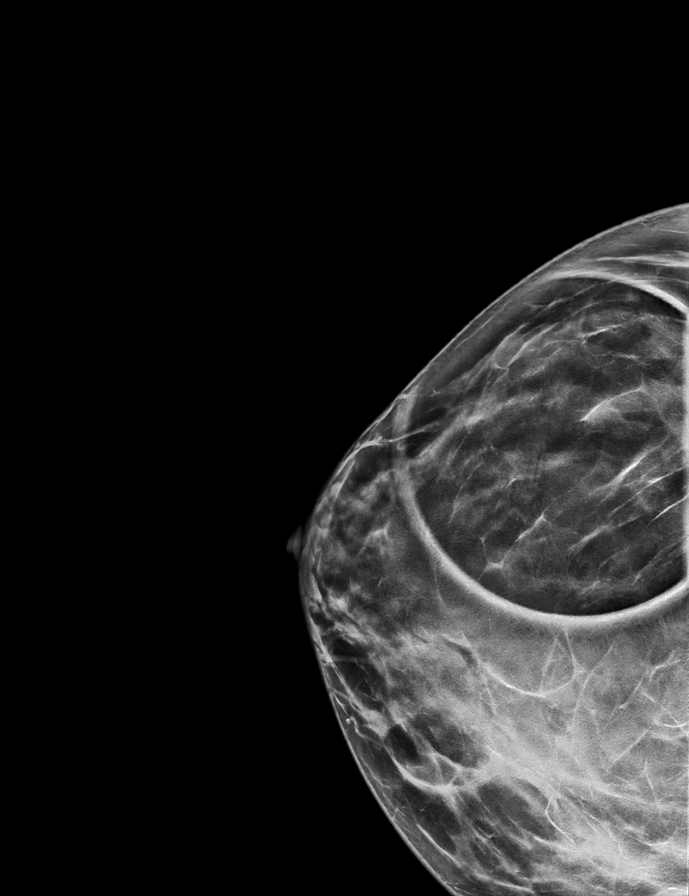

[R MLO tomo (1 of 2) · tomo slice 38/75.0]
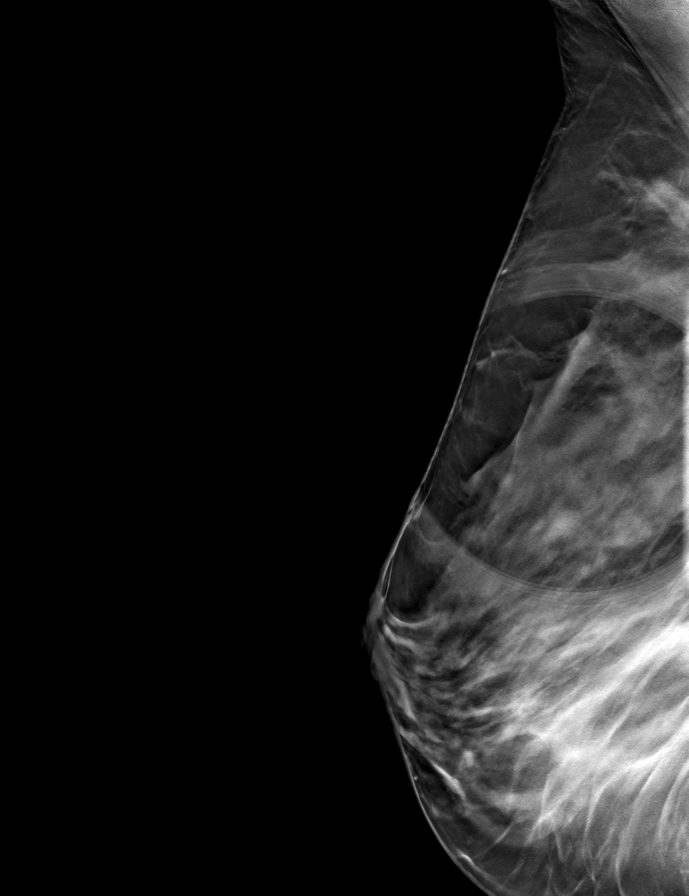

[R MLO tomo (2 of 2) · tomo slice 35/69.0]
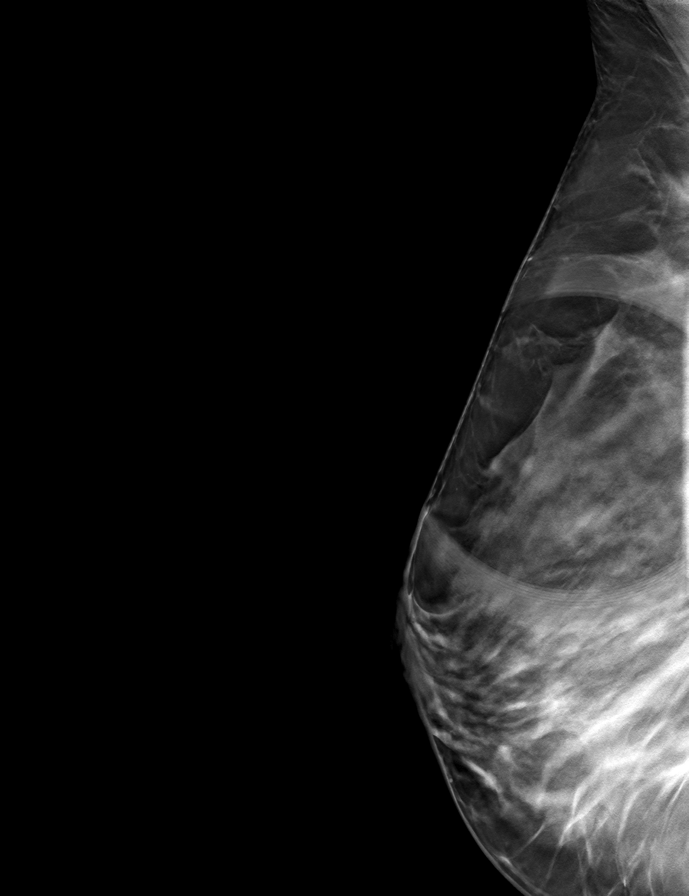

[R CC tomo (1 of 2) · tomo slice 43/85.0]
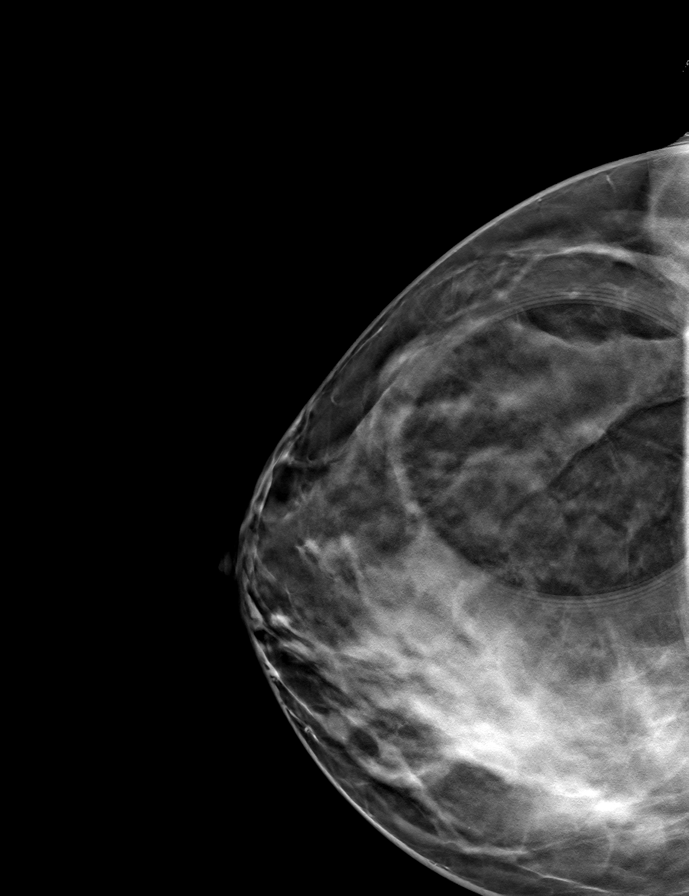

[R CC tomo (2 of 2) · tomo slice 37/73.0]
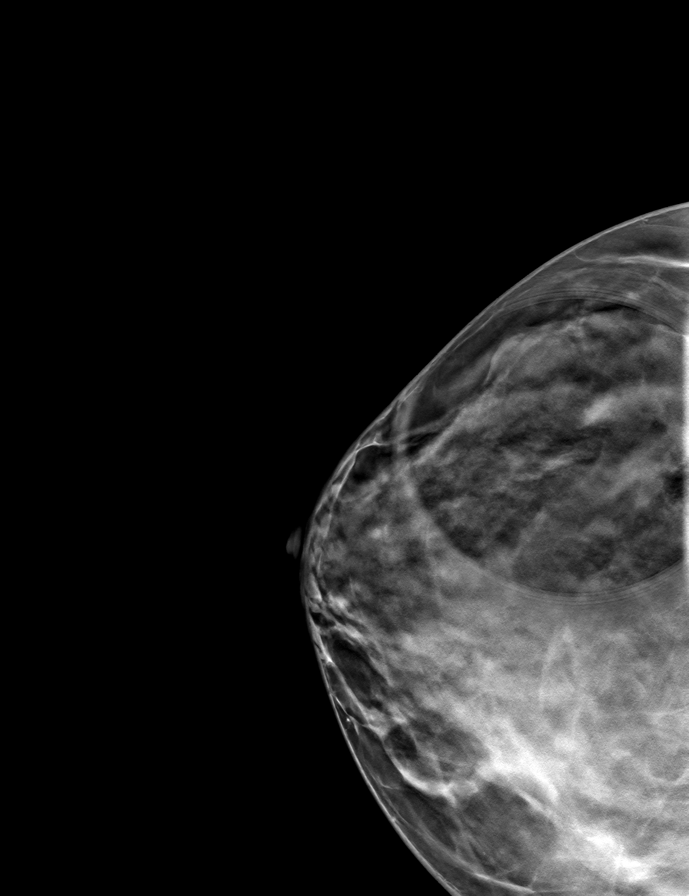

[8 of 24 positions shown; findings below may reference images not displayed]

ACR Breast Density Category c: The breast tissue is heterogeneously
dense, which may obscure small masses.
FINDINGS: Multiple spot compression tomographic images were obtained. There is
single possible oval obscured mass over the outer upper right
breast.

Targeted ultrasound is performed, showing a cluster of cysts over
the 10 o'clock position of the right breast 12 cm from the nipple
measuring 5 x 8 x 9 mm. Over the 10 o'clock position of the right
breast 4 cm from the nipple is an oval/lobulated hypoechoic mass
with mild border irregularity. This has parallel long axis and
measures 1.1 x 1.1 x 1.9 cm and likely corresponds to the
mammographic finding. Although this likely represents a
fibroadenoma, there is slight suspicion.

Ultrasound of the right axilla is normal.
IMPRESSION: Indeterminate mass over the 10 o'clock position of the right breast
4 cm from the nipple measuring 1.1 x 1.1 x 1.9 cm.

RECOMMENDATION:
Recommend ultrasound-guided core needle biopsy for further
evaluation.

I have discussed the findings and recommendations with the patient.
Results were also provided in writing at the conclusion of the
visit. If applicable, a reminder letter will be sent to the patient
regarding the next appointment.

BI-RADS CATEGORY  4: Suspicious.

Biopsy will be scheduled here at the [REDACTED] prior to
patient's departure.

## 2019-09-03 IMAGING — US ULTRASOUND RIGHT BREAST LIMITED
1 series · 11 of 11 positions shown · non-contrast
Comparison: Previous exam(s).

CLINICAL DATA: Patient presents for additional views of the right
breast as follow-up to recent screening exam to evaluate 2 possible
masses over the upper-outer breast.

EXAM:
DIGITAL DIAGNOSTIC right MAMMOGRAM
ULTRASOUND right BREAST

[Series 1: ultrasound right breast limited · 0.07mm/px · 11 of 11 slices shown]
[im 1/11]
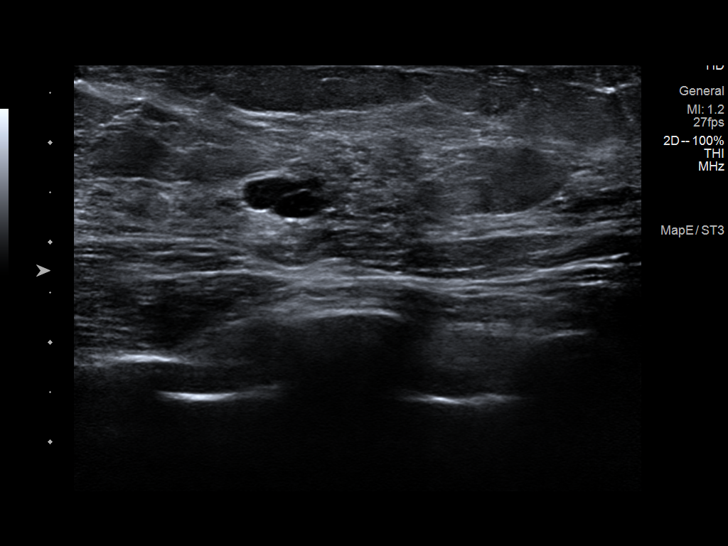
[im 2/11]
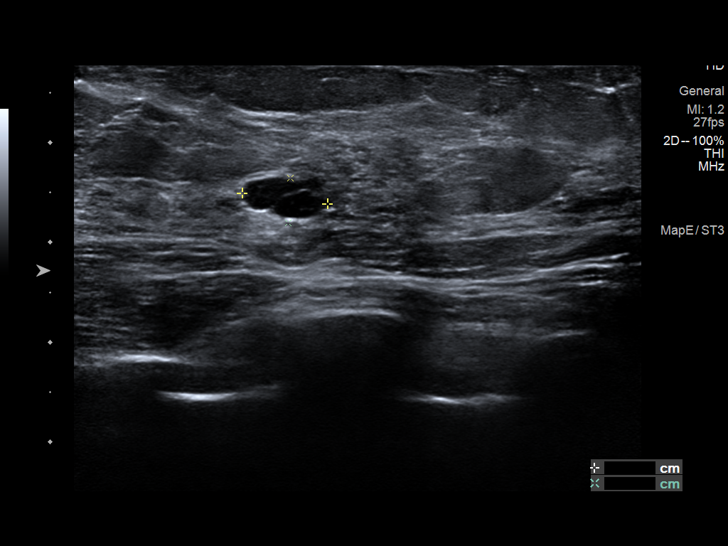
[im 3/11]
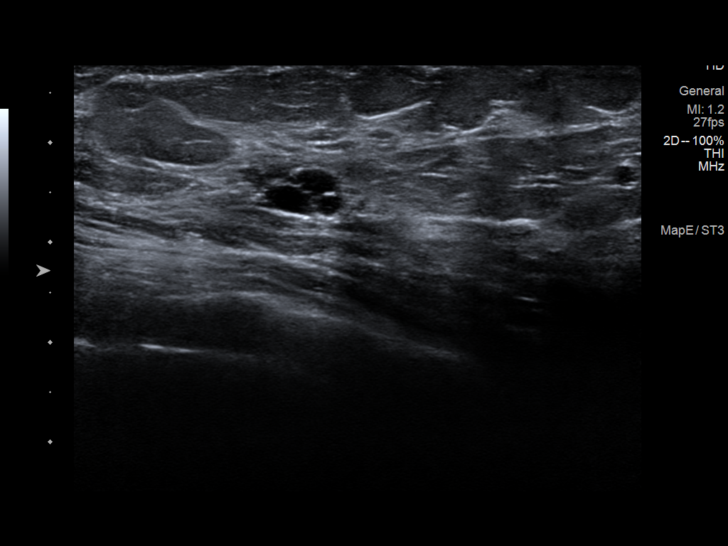
[im 4/11]
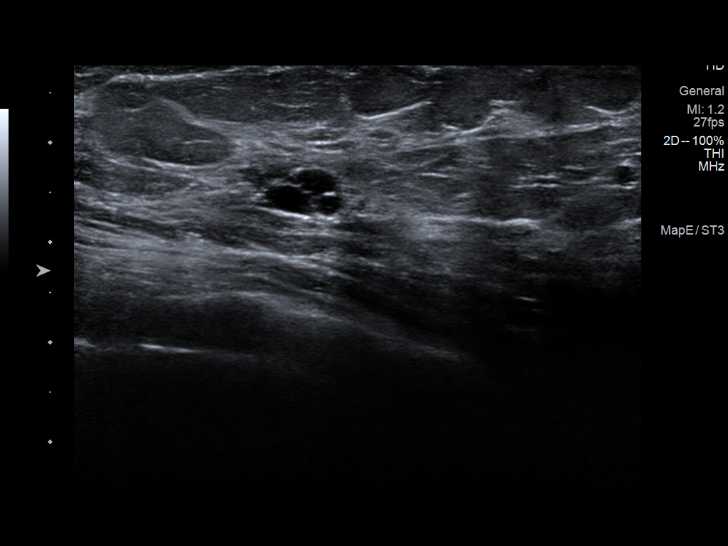
[im 5/11]
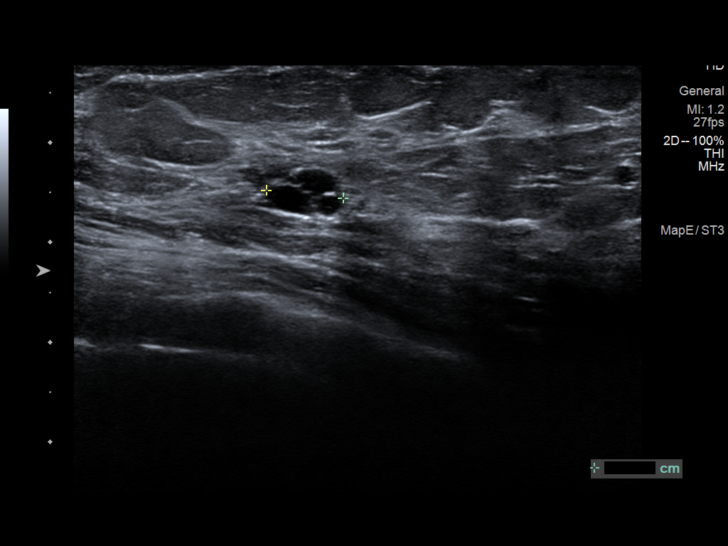
[im 6/11]
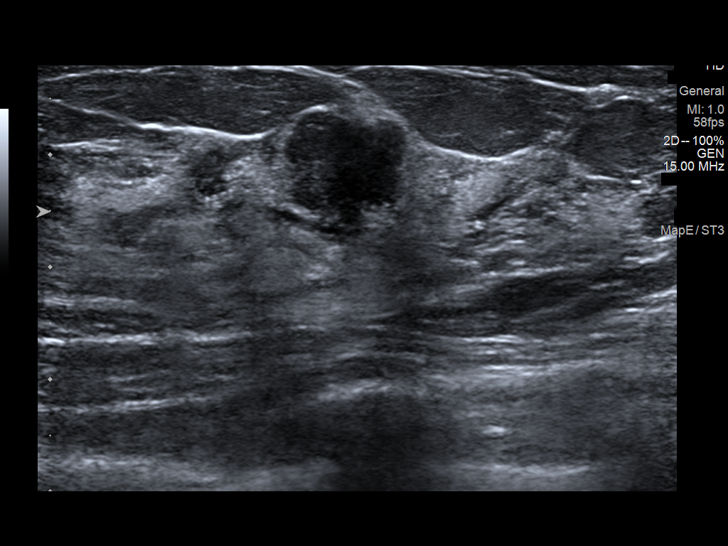
[im 7/11]
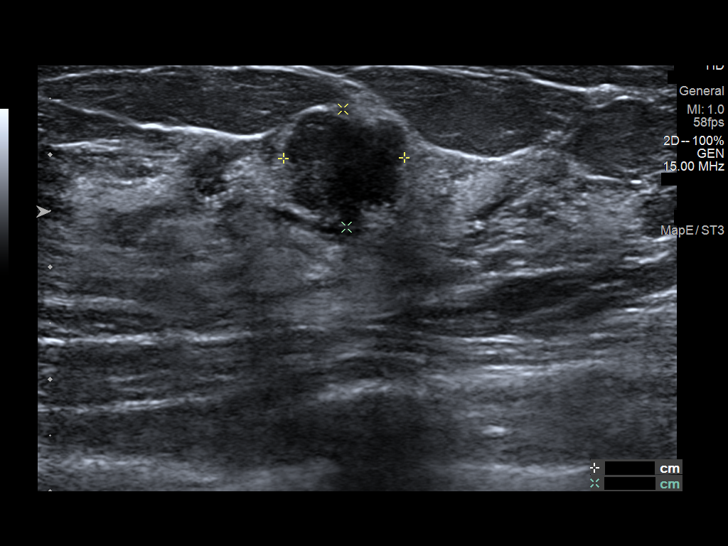
[im 8/11]
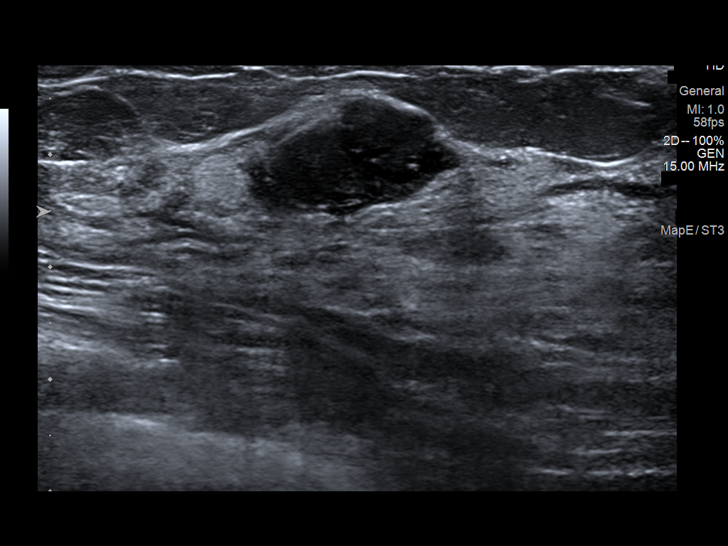
[im 9/11]
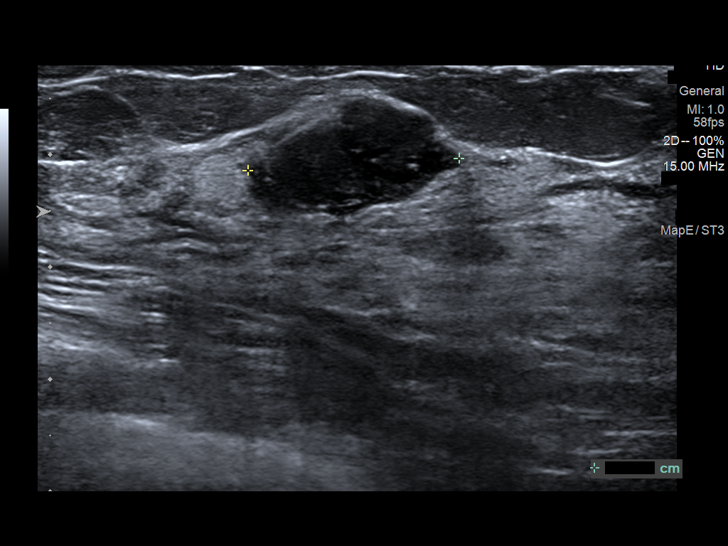
[im 10/11]
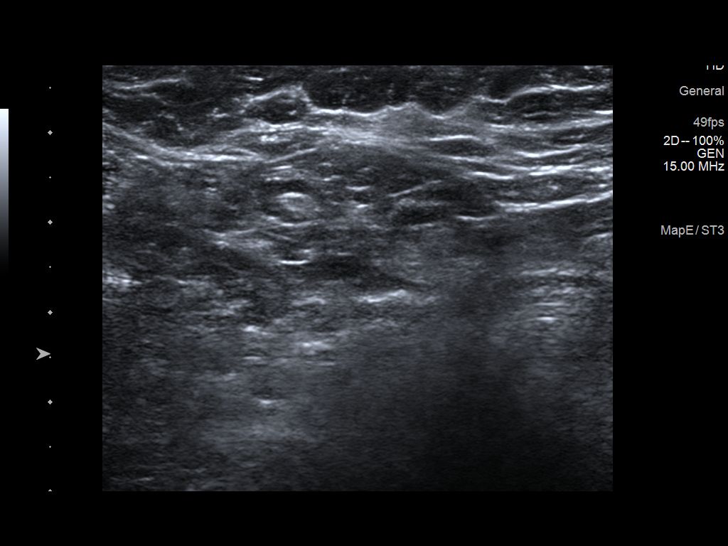
[im 11/11]
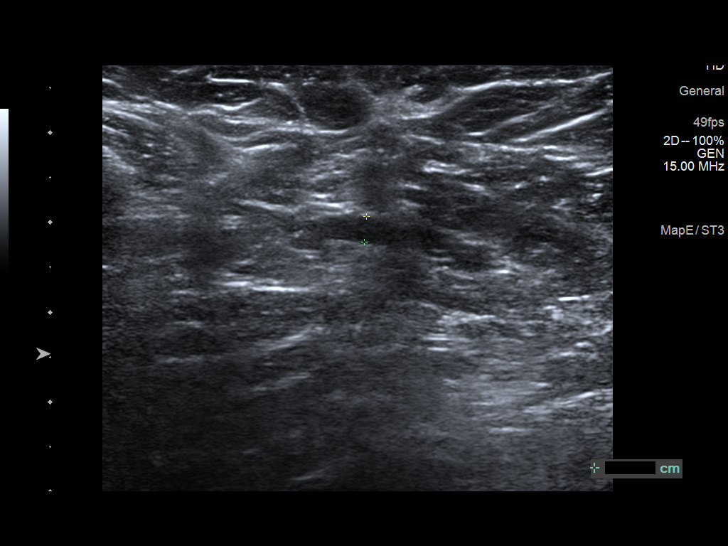

[11 of 11 positions shown; findings below may reference images not displayed]

ACR Breast Density Category c: The breast tissue is heterogeneously
dense, which may obscure small masses.
FINDINGS: Multiple spot compression tomographic images were obtained. There is
single possible oval obscured mass over the outer upper right
breast.

Targeted ultrasound is performed, showing a cluster of cysts over
the 10 o'clock position of the right breast 12 cm from the nipple
measuring 5 x 8 x 9 mm. Over the 10 o'clock position of the right
breast 4 cm from the nipple is an oval/lobulated hypoechoic mass
with mild border irregularity. This has parallel long axis and
measures 1.1 x 1.1 x 1.9 cm and likely corresponds to the
mammographic finding. Although this likely represents a
fibroadenoma, there is slight suspicion.

Ultrasound of the right axilla is normal.
IMPRESSION: Indeterminate mass over the 10 o'clock position of the right breast
4 cm from the nipple measuring 1.1 x 1.1 x 1.9 cm.

RECOMMENDATION:
Recommend ultrasound-guided core needle biopsy for further
evaluation.

I have discussed the findings and recommendations with the patient.
Results were also provided in writing at the conclusion of the
visit. If applicable, a reminder letter will be sent to the patient
regarding the next appointment.

BI-RADS CATEGORY  4: Suspicious.

Biopsy will be scheduled here at the [REDACTED] prior to
patient's departure.

## 2019-09-10 IMAGING — US US BREAST BX W LOC DEV 1ST LESION IMG BX SPEC US GUIDE*R*
1 series · 10 of 10 positions shown · non-contrast
Comparison: Previous exam(s).

ADDENDUM:
Pathology revealed FIBROADENOMA of RIGHT breast, 10 o'clock. This
was found to be concordant by Dr. Joslins Loxana.

Pathology results were discussed with the patient's husband, Tarla
by telephone, per patient request. The patient's husband reported
doing well after the biopsy with tenderness at the site. Post biopsy
instructions and care were reviewed and questions were answered. The
patient's husband was encouraged to call The [REDACTED] of
The patient was instructed to return for RIGHT diagnostic
mammography and ultrasound in 6 months which has been scheduled for
11/16/18.
Pathology results reported by Navin Mcgowan, RN on 05/18/2018.
CLINICAL DATA: Right breast 10 o'clock mass
EXAM:
ULTRASOUND GUIDED RADIOACTIVE SEED LOCALIZATION OF THE RIGHT BREAST

[Series 1: us breast bx w loc dev 1st lesion img bx spec us g · 0.06mm/px · 10 of 10 slices shown]
[im 1/10]
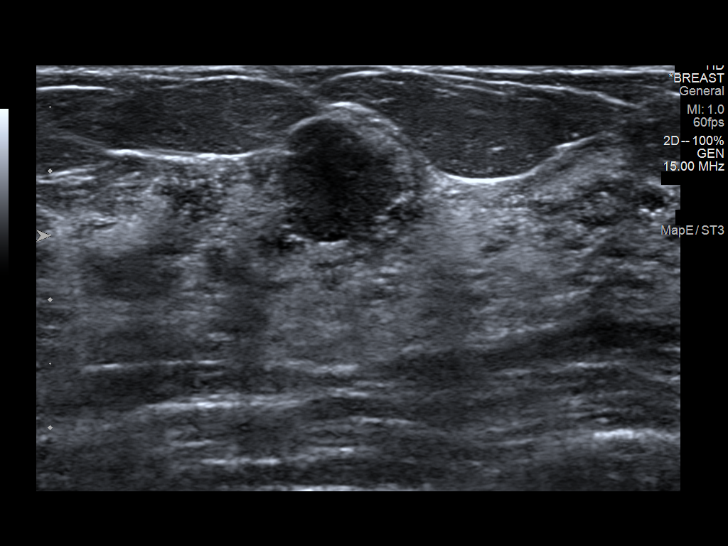
[im 2/10]
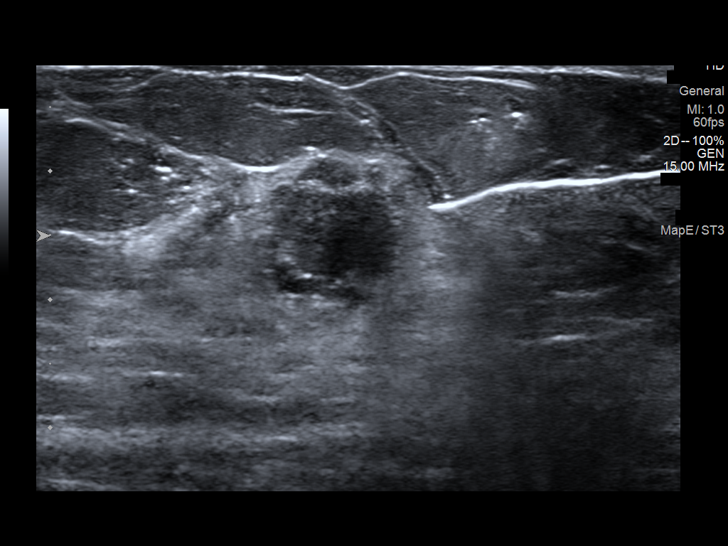
[im 3/10]
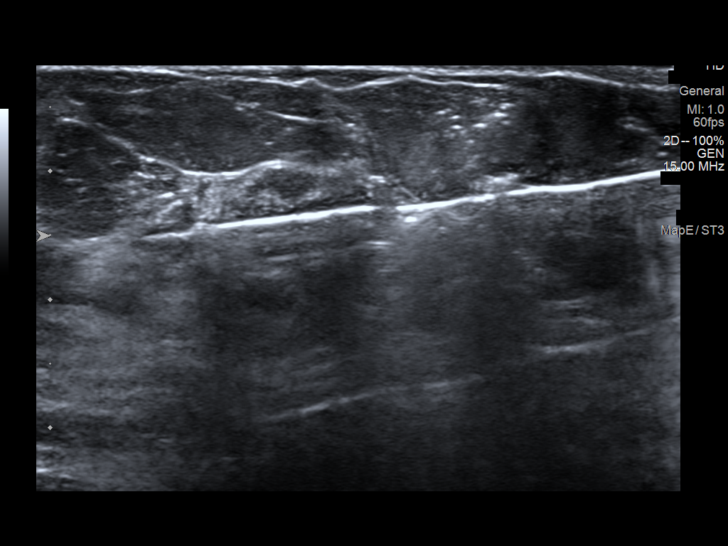
[im 4/10]
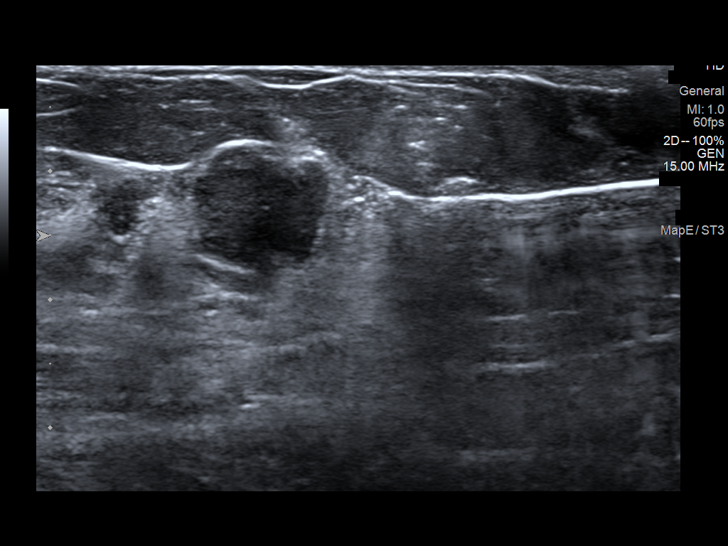
[im 5/10]
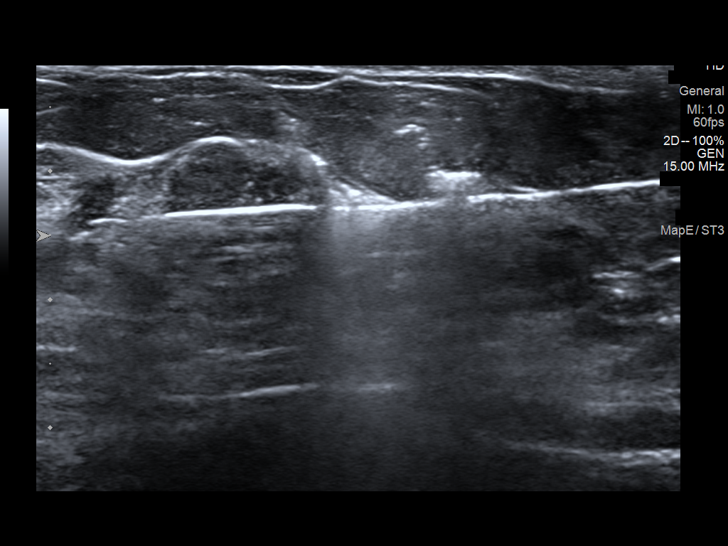
[im 6/10]
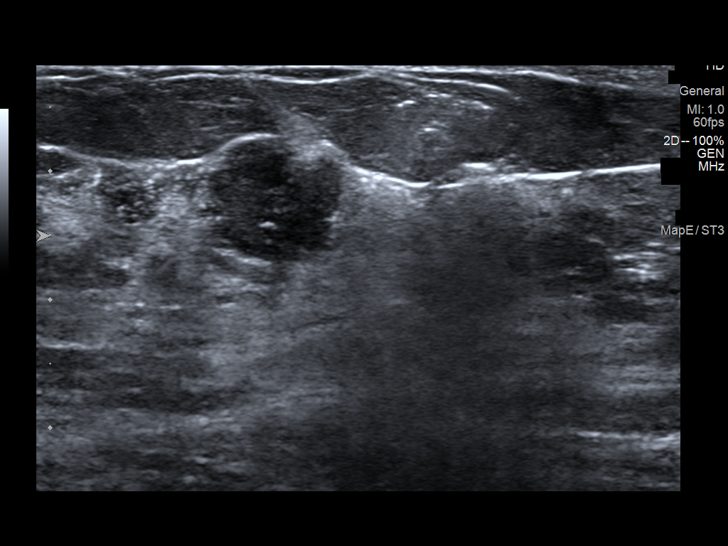
[im 7/10]
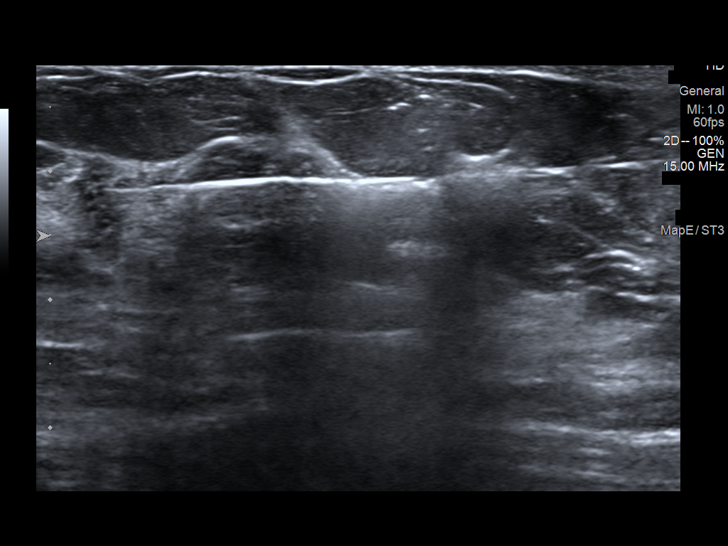
[im 8/10]
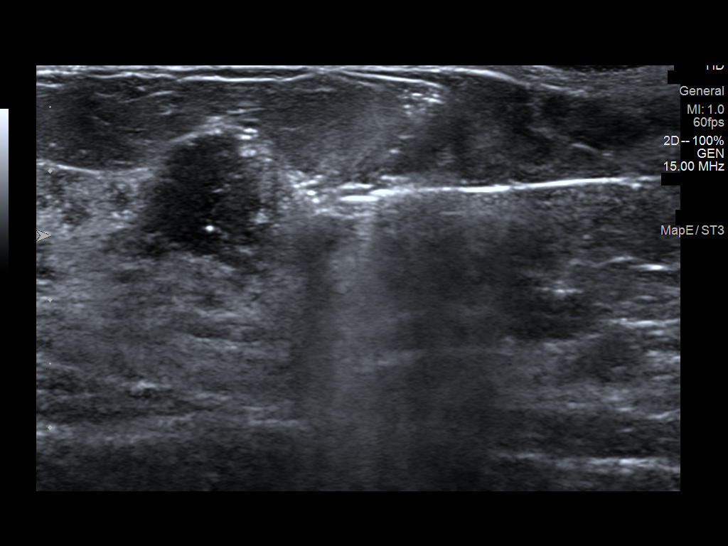
[im 9/10]
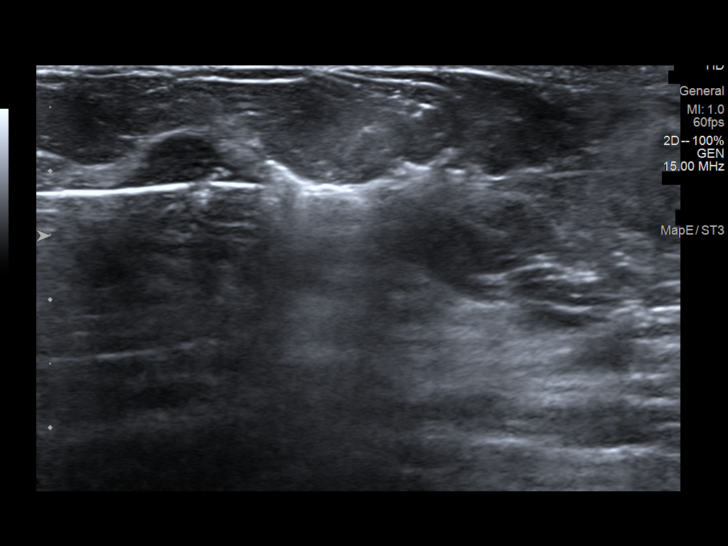
[im 10/10]
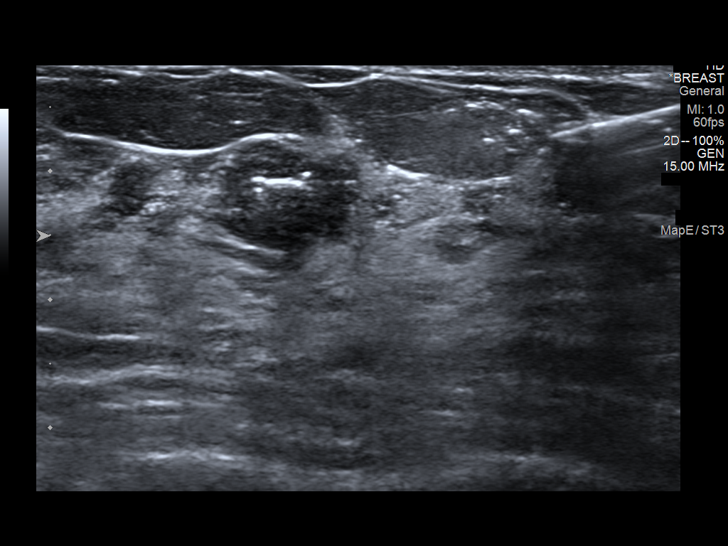

[10 of 10 positions shown; findings below may reference images not displayed]



Lesion quadrant: Upper outer quadrant

Using sterile technique and 1% Lidocaine as local anesthetic, under
direct ultrasound visualization, a 14 gauge Belza device was
used to perform biopsy of right breast 10 o'clock mass using a
inferior approach. At the conclusion of the procedure a ribbon
shaped tissue marker clip was deployed into the biopsy cavity.
Follow up 2 view mammogram was performed and dictated separately.
IMPRESSION: Ultrasound guided biopsy of right breast. No apparent complications.

## 2019-11-18 ENCOUNTER — Ambulatory Visit
Admission: RE | Admit: 2019-11-18 | Discharge: 2019-11-18 | Disposition: A | Discharge status: Home / Self Care | Payer: 59 | Source: Ambulatory Visit | Attending: Obstetrics and Gynecology | Admitting: Obstetrics and Gynecology

## 2019-11-18 ENCOUNTER — Other Ambulatory Visit: Payer: Self-pay | Admitting: Obstetrics and Gynecology

## 2019-11-18 ENCOUNTER — Other Ambulatory Visit: Payer: Self-pay

## 2019-11-18 DIAGNOSIS — N6320 Unspecified lump in the left breast, unspecified quadrant: Secondary | ICD-10-CM

## 2020-05-25 ENCOUNTER — Other Ambulatory Visit: Payer: Self-pay

## 2020-05-25 ENCOUNTER — Ambulatory Visit
Admission: RE | Admit: 2020-05-25 | Discharge: 2020-05-25 | Disposition: A | Discharge status: Home / Self Care | Payer: 59 | Source: Ambulatory Visit | Attending: Obstetrics and Gynecology | Admitting: Obstetrics and Gynecology

## 2020-05-25 DIAGNOSIS — N6320 Unspecified lump in the left breast, unspecified quadrant: Secondary | ICD-10-CM

## 2021-04-19 ENCOUNTER — Other Ambulatory Visit: Payer: Self-pay | Admitting: Obstetrics and Gynecology

## 2021-04-19 DIAGNOSIS — Z09 Encounter for follow-up examination after completed treatment for conditions other than malignant neoplasm: Secondary | ICD-10-CM

## 2021-05-06 ENCOUNTER — Ambulatory Visit: Payer: 59 | Admitting: Orthopedic Surgery

## 2021-05-21 ENCOUNTER — Ambulatory Visit: Payer: 59 | Admitting: Orthopedic Surgery

## 2021-05-24 ENCOUNTER — Other Ambulatory Visit: Payer: Self-pay | Admitting: Obstetrics and Gynecology

## 2021-05-24 DIAGNOSIS — N63 Unspecified lump in unspecified breast: Secondary | ICD-10-CM

## 2021-05-27 ENCOUNTER — Ambulatory Visit
Admission: RE | Admit: 2021-05-27 | Discharge: 2021-05-27 | Disposition: A | Discharge status: Home / Self Care | Payer: 59 | Source: Ambulatory Visit | Attending: Obstetrics and Gynecology | Admitting: Obstetrics and Gynecology

## 2021-05-27 ENCOUNTER — Other Ambulatory Visit: Payer: Self-pay

## 2021-05-27 DIAGNOSIS — N63 Unspecified lump in unspecified breast: Secondary | ICD-10-CM

## 2022-04-21 ENCOUNTER — Other Ambulatory Visit: Payer: Self-pay | Admitting: Obstetrics and Gynecology

## 2022-05-27 ENCOUNTER — Other Ambulatory Visit: Payer: Self-pay | Admitting: Obstetrics and Gynecology

## 2022-05-27 DIAGNOSIS — Z1231 Encounter for screening mammogram for malignant neoplasm of breast: Secondary | ICD-10-CM

## 2022-06-09 ENCOUNTER — Ambulatory Visit
Admission: RE | Admit: 2022-06-09 | Discharge: 2022-06-09 | Disposition: A | Discharge status: Home / Self Care | Payer: BC Managed Care – PPO | Source: Ambulatory Visit | Attending: Obstetrics and Gynecology | Admitting: Obstetrics and Gynecology

## 2022-06-09 DIAGNOSIS — Z1231 Encounter for screening mammogram for malignant neoplasm of breast: Secondary | ICD-10-CM

## 2023-06-12 ENCOUNTER — Other Ambulatory Visit: Payer: Self-pay | Admitting: Obstetrics and Gynecology

## 2023-06-12 DIAGNOSIS — Z0000 Encounter for general adult medical examination without abnormal findings: Secondary | ICD-10-CM

## 2023-07-03 ENCOUNTER — Ambulatory Visit
Admission: RE | Admit: 2023-07-03 | Discharge: 2023-07-03 | Disposition: A | Discharge status: Home / Self Care | Payer: BC Managed Care – PPO | Source: Ambulatory Visit | Attending: Obstetrics and Gynecology | Admitting: Obstetrics and Gynecology

## 2023-07-03 DIAGNOSIS — Z0000 Encounter for general adult medical examination without abnormal findings: Secondary | ICD-10-CM

## 2024-06-07 ENCOUNTER — Other Ambulatory Visit: Payer: Self-pay | Admitting: Obstetrics and Gynecology

## 2024-06-07 DIAGNOSIS — Z1231 Encounter for screening mammogram for malignant neoplasm of breast: Secondary | ICD-10-CM

## 2024-06-18 ENCOUNTER — Emergency Department (HOSPITAL_BASED_OUTPATIENT_CLINIC_OR_DEPARTMENT_OTHER)
Admission: EM | Admit: 2024-06-18 | Discharge: 2024-06-18 | Discharge status: Against Medical Advice | Attending: Emergency Medicine | Admitting: Emergency Medicine

## 2024-06-18 ENCOUNTER — Other Ambulatory Visit: Payer: Self-pay

## 2024-06-18 DIAGNOSIS — Z5321 Procedure and treatment not carried out due to patient leaving prior to being seen by health care provider: Secondary | ICD-10-CM | POA: Insufficient documentation

## 2024-06-18 DIAGNOSIS — R21 Rash and other nonspecific skin eruption: Secondary | ICD-10-CM | POA: Diagnosis present

## 2024-06-18 NOTE — ED Triage Notes (Addendum)
 Ubiquitous body rash starting yesterday-wheals. Denies SOB, throat irritation, CP. NKA. Takes Fe+ supplement daily.

## 2024-06-28 ENCOUNTER — Other Ambulatory Visit: Payer: Self-pay

## 2024-06-28 ENCOUNTER — Encounter (HOSPITAL_COMMUNITY): Payer: Self-pay

## 2024-06-28 ENCOUNTER — Emergency Department (HOSPITAL_COMMUNITY)
Admission: EM | Admit: 2024-06-28 | Discharge: 2024-06-29 | Disposition: A | Discharge status: Home / Self Care | Attending: Emergency Medicine | Admitting: Emergency Medicine

## 2024-06-28 DIAGNOSIS — R21 Rash and other nonspecific skin eruption: Secondary | ICD-10-CM | POA: Diagnosis present

## 2024-06-28 LAB — CBC WITH DIFFERENTIAL/PLATELET
Abs Immature Granulocytes: 0.05 K/uL (ref 0.00–0.07)
Basophils Absolute: 0 K/uL (ref 0.0–0.1)
Basophils Relative: 0 %
Eosinophils Absolute: 0.2 K/uL (ref 0.0–0.5)
Eosinophils Relative: 1 %
HCT: 36.3 % (ref 36.0–46.0)
Hemoglobin: 11.4 g/dL — ABNORMAL LOW (ref 12.0–15.0)
Immature Granulocytes: 0 %
Lymphocytes Relative: 12 %
Lymphs Abs: 1.3 K/uL (ref 0.7–4.0)
MCH: 25.7 pg — ABNORMAL LOW (ref 26.0–34.0)
MCHC: 31.4 g/dL (ref 30.0–36.0)
MCV: 81.9 fL (ref 80.0–100.0)
Monocytes Absolute: 0.4 K/uL (ref 0.1–1.0)
Monocytes Relative: 4 %
Neutro Abs: 9.4 K/uL — ABNORMAL HIGH (ref 1.7–7.7)
Neutrophils Relative %: 83 %
Platelets: 385 K/uL (ref 150–400)
RBC: 4.43 MIL/uL (ref 3.87–5.11)
RDW: 14.2 % (ref 11.5–15.5)
WBC: 11.4 K/uL — ABNORMAL HIGH (ref 4.0–10.5)
nRBC: 0 % (ref 0.0–0.2)

## 2024-06-28 LAB — COMPREHENSIVE METABOLIC PANEL WITH GFR
ALT: 57 U/L — ABNORMAL HIGH (ref 0–44)
AST: 55 U/L — ABNORMAL HIGH (ref 15–41)
Albumin: 3.2 g/dL — ABNORMAL LOW (ref 3.5–5.0)
Alkaline Phosphatase: 97 U/L (ref 38–126)
Anion gap: 12 (ref 5–15)
BUN: 12 mg/dL (ref 6–20)
CO2: 21 mmol/L — ABNORMAL LOW (ref 22–32)
Calcium: 8.5 mg/dL — ABNORMAL LOW (ref 8.9–10.3)
Chloride: 102 mmol/L (ref 98–111)
Creatinine, Ser: 0.87 mg/dL (ref 0.44–1.00)
GFR, Estimated: >60 mL/min
Glucose, Bld: 98 mg/dL (ref 70–99)
Potassium: 3.9 mmol/L (ref 3.5–5.1)
Sodium: 135 mmol/L (ref 135–145)
Total Bilirubin: 0.6 mg/dL (ref 0.0–1.2)
Total Protein: 6.5 g/dL (ref 6.5–8.1)

## 2024-06-28 LAB — I-STAT CG4 LACTIC ACID, ED: Lactic Acid, Venous: 0.6 mmol/L (ref 0.5–1.9)

## 2024-06-28 LAB — HCG, SERUM, QUALITATIVE: Preg, Serum: NEGATIVE

## 2024-06-28 NOTE — ED Triage Notes (Signed)
 Pt states that she has been having an allergic reaction x1 week. Generalized rash and swelling over her joints.

## 2024-06-29 DIAGNOSIS — R21 Rash and other nonspecific skin eruption: Secondary | ICD-10-CM | POA: Diagnosis not present

## 2024-06-29 MED ORDER — PREDNISONE 10 MG (21) PO TBPK: ORAL_TABLET | Freq: Every day | ORAL | 0 refills | Status: AC

## 2024-06-29 MED ORDER — KETOROLAC TROMETHAMINE 15 MG/ML IJ SOLN
15.0000 mg | Freq: Once | INTRAMUSCULAR | Status: AC
  Administered 2024-06-29: 15 mg via INTRAMUSCULAR
  Filled 2024-06-29: qty 1

## 2024-06-29 MED ORDER — DEXAMETHASONE SOD PHOSPHATE PF 10 MG/ML IJ SOLN
10.0000 mg | Freq: Once | INTRAMUSCULAR | Status: AC
  Administered 2024-06-29: 10 mg via INTRAMUSCULAR

## 2024-06-29 NOTE — ED Provider Notes (Signed)
 [facility]PART[facility] AT [facility] Provider Note   CSN: [id number] Arrival date & time: 06/28/24  8165     Patient presents with: Allergic Reaction   Rachel Hodge is a 48 y.o. female.   47 year old female presents to the emergency department for evaluation of a rash.  She has been experiencing symptoms for approximately 2 weeks.  Rash is more sore, not described as itchy.  She has developed some associated fatigue as well as swelling in her joints which has made it more challenging/uncomfortable for her to ambulate.  Reports subjective, tactile fever prior to arrival; however, no documented temperature above 100.4 F.  No shortness of breath, inability to swallow, drooling, vomiting.  Denies known tick exposure, does not have family pets, has not been in the outdoors. No known sick contacts or contacts with persons with similar rash. Denies new soaps/lotions/detergents.  The history is provided by the patient and the spouse. No language interpreter was used.  Allergic Reaction      Prior to Admission medications   Medication Sig Start Date End Date Taking? Authorizing Provider  predniSONE (STERAPRED UNI-PAK 21 TAB) 10 MG (21) TBPK tablet Take by mouth daily. Take 6 tabs by mouth daily  for 2 days, then 5 tabs for 2 days, then 4 tabs for 2 days, then 3 tabs for 2 days, 2 tabs for 2 days, then 1 tab by mouth daily for 2 days 06/29/24  Yes Keith Sor, PA-C  HYDROcodone -acetaminophen  (NORCO/VICODIN) 5-325 MG tablet Take 1 tablet by mouth every 6 (six) hours as needed for moderate pain. 04/12/19   Rosalva Sawyer, MD  ibuprofen  (ADVIL ) 800 MG tablet Take 1 tablet (800 mg total) by mouth every 8 (eight) hours as needed. 04/12/19   Rosalva Sawyer, MD    Allergies: Patient has no known allergies.    Review of Systems Ten systems reviewed and are negative for acute change, except as noted in the HPI.    Updated Vital Signs BP 127/77 (BP Location: Right Arm)   Pulse 75    Temp 98.8 F (37.1 C) (Oral)   Resp 18   Ht 5' 8 (1.727 m)   LMP 06/17/2024 (Approximate)   SpO2 96%   BMI 29.27 kg/m   Physical Exam Vitals and nursing note reviewed.  Constitutional:      General: She is not in acute distress.    Appearance: She is well-developed. She is not diaphoretic.  HENT:     Head: Normocephalic and atraumatic.     Mouth/Throat:     Comments: Oropharynx clear without edema, lesions. Tolerating secretions. No tripoding or stridor. Eyes:     General: No scleral icterus.    Conjunctiva/sclera: Conjunctivae normal.  Cardiovascular:     Rate and Rhythm: Normal rate and regular rhythm.     Pulses: Normal pulses.  Pulmonary:     Effort: Pulmonary effort is normal. No respiratory distress.     Breath sounds: No stridor. No wheezing.     Comments: Respirations even and unlabored Musculoskeletal:        General: Normal range of motion.     Cervical back: Normal range of motion.     Comments: Joint swelling b/l hands and digits.  Skin:    General: Skin is warm and dry.     Coloration: Skin is not pale.     Findings: Rash present.  Neurological:     Mental Status: She is alert and oriented to person, place, and time.  Coordination: Coordination normal.  Psychiatric:        Behavior: Behavior normal.          (all labs ordered are listed, but only abnormal results are displayed) Labs Reviewed  COMPREHENSIVE METABOLIC PANEL WITH GFR - Abnormal; Notable for the following components:      Result Value   CO2 21 (*)    Calcium 8.5 (*)    Albumin 3.2 (*)    AST 55 (*)    ALT 57 (*)    All other components within normal limits  CBC WITH DIFFERENTIAL/PLATELET - Abnormal; Notable for the following components:   WBC 11.4 (*)    Hemoglobin 11.4 (*)    MCH 25.7 (*)    Neutro Abs 9.4 (*)    All other components within normal limits  HCG, SERUM, QUALITATIVE  I-STAT CG4 LACTIC ACID, ED    EKG: None  Radiology: No results found.   Procedures    Medications Ordered in the ED  dexamethasone  (DECADRON ) injection 10 mg (10 mg Intramuscular Given 06/29/24 0130)  ketorolac  (TORADOL ) 15 MG/ML injection 15 mg (15 mg Intramuscular Given 06/29/24 0158)    Clinical Course as of 06/29/24 0310  Sat Jun 29, 2024  0114 Symptoms appear most consistent with erythema multiforme.  Will give dose of Decadron  in the emergency department.  May benefit from outpatient prednisone taper. [KH]  0309 Rash not concerning for skin scalded syndrome, SJS. Patient is nontoxic appearing, afebrile. There is no oral/mucosal involvement based on exam.  [KH]    Clinical Course User Index [KH] Keith Sor, PA-C                                 Medical Decision Making Amount and/or Complexity of Data Reviewed Labs: ordered.  Risk Prescription drug management.   This patient presents to the ED for concern of rash, this involves an extensive number of treatment options, and is a complaint that carries with it a high risk of complications and morbidity.  The differential diagnosis includes contact dermatitis vs psoriasis vs lupus vs urticaria vs erythema multiforme vs SJS   Co morbidities that complicate the patient evaluation  None known   Additional history obtained:  Additional history obtained from spouse, at bedside   Lab Tests:  I Ordered, and personally interpreted labs.  The pertinent results include:  WBC 11.4. Hgb 11.4. AST 55, ALT 57. Lactate normal. Negative pregnancy.    Cardiac Monitoring:  The patient was maintained on a cardiac monitor.  I personally viewed and interpreted the cardiac monitored which showed an underlying rhythm of: NSR   Medicines ordered and prescription drug management:  I ordered medication including Decadron  and Toradol  for pain and rash  I have reviewed the patients home medicines and have made adjustments as needed   Test Considered:  ANA - deferred to outpatient if deemed necessary   Problem List /  ED Course:  As above   Reevaluation:  After the interventions noted above, I reevaluated the patient and found that they have :remained stable   Social Determinants of Health:  Language barrier   Dispostion:  After consideration of the diagnostic results and the patients response to treatment, I feel that the patent would benefit from prednisone taper with close PCP f/u. May necessitate dermatology or rheumatologic referral if symptoms remain persistent.       Final diagnoses:  Maculopapular rash, generalized  ED Discharge Orders          Ordered    predniSONE (STERAPRED UNI-PAK 21 TAB) 10 MG (21) TBPK tablet  Daily        06/29/24 0307               Keith Sor, PA-C 06/29/24 9685    Carita Senior, MD 06/29/24 [id number]

## 2024-06-29 NOTE — ED Notes (Signed)
 Pt was seen recently and started on abx and steroids per visitor but medication not working. Pt placed in gown. Rash noted to all extremities

## 2024-06-29 NOTE — Discharge Instructions (Addendum)
 We recommend prednisone as prescribed until finished. You should follow up with your primary care doctor for recheck of your symptoms. You may need referral to a dermatologist or rheumatologist if symptoms persist despite medications. Use Tylenol  for pain.  Return to the ED for new or concerning symptoms.

## 2024-07-03 ENCOUNTER — Ambulatory Visit
Admission: RE | Admit: 2024-07-03 | Discharge: 2024-07-03 | Disposition: A | Discharge status: Home / Self Care | Source: Ambulatory Visit | Attending: Obstetrics and Gynecology | Admitting: Obstetrics and Gynecology

## 2024-07-03 DIAGNOSIS — Z1231 Encounter for screening mammogram for malignant neoplasm of breast: Secondary | ICD-10-CM
